# Patient Record
Sex: Male | Born: 1950 | Race: White | Hispanic: No | Marital: Married | Smoking: Never smoker
Health system: Southern US, Community
[De-identification: ages and names within clinical notes are randomized; demographics above are authoritative.]

## PROBLEM LIST (undated history)

## (undated) DIAGNOSIS — K219 Gastro-esophageal reflux disease without esophagitis: Secondary | ICD-10-CM

## (undated) DIAGNOSIS — M1990 Unspecified osteoarthritis, unspecified site: Secondary | ICD-10-CM

## (undated) DIAGNOSIS — E7800 Pure hypercholesterolemia, unspecified: Secondary | ICD-10-CM

## (undated) HISTORY — PX: FOOT SURGERY: SHX648

## (undated) HISTORY — DX: Pure hypercholesterolemia, unspecified: E78.00

## (undated) HISTORY — PX: COLONOSCOPY: SHX174

## (undated) HISTORY — PX: TONSILLECTOMY: SUR1361

---

## 2005-07-24 ENCOUNTER — Ambulatory Visit: Payer: Self-pay | Admitting: Unknown Physician Specialty

## 2009-09-12 ENCOUNTER — Ambulatory Visit: Payer: Self-pay | Admitting: Specialist

## 2010-10-01 ENCOUNTER — Ambulatory Visit: Payer: Self-pay | Admitting: Unknown Physician Specialty

## 2013-12-16 ENCOUNTER — Encounter: Payer: Self-pay | Admitting: Podiatry

## 2013-12-16 DIAGNOSIS — M21549 Acquired clubfoot, unspecified foot: Secondary | ICD-10-CM

## 2013-12-16 DIAGNOSIS — M2040 Other hammer toe(s) (acquired), unspecified foot: Secondary | ICD-10-CM

## 2013-12-16 DIAGNOSIS — M2010 Hallux valgus (acquired), unspecified foot: Secondary | ICD-10-CM

## 2013-12-21 ENCOUNTER — Ambulatory Visit (INDEPENDENT_AMBULATORY_CARE_PROVIDER_SITE_OTHER): Payer: BC Managed Care – PPO

## 2013-12-21 ENCOUNTER — Ambulatory Visit (INDEPENDENT_AMBULATORY_CARE_PROVIDER_SITE_OTHER): Payer: BC Managed Care – PPO | Admitting: Podiatry

## 2013-12-21 ENCOUNTER — Encounter: Payer: Self-pay | Admitting: Podiatry

## 2013-12-21 VITALS — BP 118/87 | HR 100 | Temp 99.2°F | Resp 16 | Ht 70.0 in | Wt 200.0 lb

## 2013-12-21 DIAGNOSIS — Z9889 Other specified postprocedural states: Secondary | ICD-10-CM

## 2013-12-21 NOTE — Progress Notes (Signed)
Willie Campos presents today 5 days status post Lapidus bunion repair, second metatarsal osteotomy with screw, hammertoe repair, PIPJ arthrodesis with screw second digit right foot. He denies fever chills nausea vomiting muscle aches or pains. He states that the cast is getting a little itchy. He has had very little pain. Minimal use of narcotics.  Objective: Vital signs are stable he is alert and oriented x3. He presents today with his cast and knee scooter. Manipulating the knee scooter quite well. Radiographic evaluation demonstrates very nice Lapidus procedure with screw fixation. Second metatarsal osteotomy was with screw fixation and second toe with screw fixation all in good alignment and intact. Evaluation of the cast and toes do not demonstrate any type of irritation to the toes whatsoever he has good sensation to all the toes right foot.  Assessment: Well-healing surgical foot status post 1 week.  Plan: I will followup with him in one week to remove the casts and the dressing of from the left foot. At that time we will more than likely reapply a cast and another dry sterile compressive dressing. I will followup with him at that time.

## 2013-12-22 ENCOUNTER — Encounter: Payer: BC Managed Care – PPO | Admitting: Podiatry

## 2013-12-27 NOTE — Progress Notes (Signed)
1. lapidus bunion repair right 2. 2nd metatarsal osteotomy rt foot 3. Hammer  Toe repair 2nd rt  4. Cast application rt   Percocet 10/325 25 mg #50 0 refills one to two po q 6 - 8 Phenergan 25 mg #30 0 refills one po q 6 - 8 Keflex  500 mg #20 0 refills one po bid

## 2013-12-28 ENCOUNTER — Encounter: Payer: Self-pay | Admitting: Podiatry

## 2013-12-28 ENCOUNTER — Ambulatory Visit (INDEPENDENT_AMBULATORY_CARE_PROVIDER_SITE_OTHER): Payer: BC Managed Care – PPO | Admitting: Podiatry

## 2013-12-28 VITALS — BP 115/74 | HR 75 | Temp 98.0°F | Resp 16

## 2013-12-28 DIAGNOSIS — Z9889 Other specified postprocedural states: Secondary | ICD-10-CM

## 2013-12-28 NOTE — Progress Notes (Signed)
Lapidus bunion repair , second met ost with screw , hammertoe repair , pipj arthrodesis with screw second right , everything is good. As he presents today in his cast with his wife other knee scooter.  Objective: Vital signs are stable he is alert and oriented x3. Cast was intact once removed reveals dry sterile dressing intact. Once removed reveals mild edema no erythema cellulitis drainage or odor. Hallux is rectus second digit is rectus with moderate edema to the first metatarsal space and overlying the second metatarsal. Margins are well coapted sutures are intact we went ahead and removed the sutures today. He has good range of motion of the first and second metatarsophalangeal joints and I encouraged range of motion.  Assessment: Well-healing surgical foot status post Lapidus procedure with bunion correction right second metatarsal osteotomy with screw and second hammertoe repair with screw.  Plan: Redressed today with a dry sterile compressive dressing encouraged range of motion exercises and will followup with him in 2 weeks. We did reapply a new fiberglass cast today.

## 2014-01-11 ENCOUNTER — Encounter: Payer: Self-pay | Admitting: Podiatry

## 2014-01-11 ENCOUNTER — Ambulatory Visit (INDEPENDENT_AMBULATORY_CARE_PROVIDER_SITE_OTHER): Payer: BC Managed Care – PPO | Admitting: Podiatry

## 2014-01-11 ENCOUNTER — Ambulatory Visit (INDEPENDENT_AMBULATORY_CARE_PROVIDER_SITE_OTHER): Payer: BC Managed Care – PPO

## 2014-01-11 VITALS — BP 117/60 | HR 90 | Resp 16

## 2014-01-11 DIAGNOSIS — Z9889 Other specified postprocedural states: Secondary | ICD-10-CM

## 2014-01-11 NOTE — Progress Notes (Signed)
01.09.15 post op , right foot  Lapidus bun repair , sec  Met ost with screw , hammertoe repair .the patient states it is good, it throbs every now and then. He denies fever chills nausea vomiting muscle aches and pains.  Objective: Vital signs are stable he is alert and oriented x3. Cast was intact. Once removed today in a dry sterile dressing was removed demonstrates a mildly edematous forefoot right. He has good range of motion first and second metatarsophalangeal joints however some dorsiflexion is noted. This is more than likely do to holding his toes up when utilizing crutches or knee scooter. His healing quite nicely.  Assessment: Well-healing surgical foot right status post Lapidus second metatarsal osteotomy and a hammertoe repair right.  Plan: Been in a compression garment and get him back into a Cam Walker I will followup with him in 2-3 weeks for another set of x-rays.

## 2014-01-25 ENCOUNTER — Ambulatory Visit (INDEPENDENT_AMBULATORY_CARE_PROVIDER_SITE_OTHER): Payer: BC Managed Care – PPO | Admitting: Podiatry

## 2014-01-25 ENCOUNTER — Ambulatory Visit (INDEPENDENT_AMBULATORY_CARE_PROVIDER_SITE_OTHER): Payer: BC Managed Care – PPO

## 2014-01-25 VITALS — BP 116/71 | HR 80 | Resp 16 | Ht 70.0 in | Wt 200.0 lb

## 2014-01-25 DIAGNOSIS — Z9889 Other specified postprocedural states: Secondary | ICD-10-CM

## 2014-01-25 NOTE — Progress Notes (Signed)
He presents today status post Lapidus repair second metatarsal osteotomy and hammertoe repair right. He denies fever chills nausea vomiting muscle aches and pains and states it seems to be doing pretty good. He continues to use of his Cam Walker in a nonweightbearing fashion.  Objective: Vital signs are stable he is alert and oriented x3. Mild edema about the right foot with rectus hallux right. Second metatarsal osteotomy and hammertoe repair second right does demonstrate edema with some contraction and medial deviation of the toe. Radiographs confirm this. Osteotomy and fusion site appears to be healing nicely.  Assessment: Well-healing surgical foot right.  Plan: Discussed etiology pathology conservative versus surgical therapies. I placed him in a Darco digital toe splint and encouraged some weightbearing in static stance. I will followup with him in 2 weeks for reevaluation.

## 2014-02-08 ENCOUNTER — Encounter: Payer: Self-pay | Admitting: Podiatry

## 2014-02-08 ENCOUNTER — Ambulatory Visit (INDEPENDENT_AMBULATORY_CARE_PROVIDER_SITE_OTHER): Payer: BC Managed Care – PPO

## 2014-02-08 ENCOUNTER — Ambulatory Visit (INDEPENDENT_AMBULATORY_CARE_PROVIDER_SITE_OTHER): Payer: BC Managed Care – PPO | Admitting: Podiatry

## 2014-02-08 VITALS — BP 106/62 | HR 93 | Resp 16

## 2014-02-08 DIAGNOSIS — Z9889 Other specified postprocedural states: Secondary | ICD-10-CM

## 2014-02-08 NOTE — Progress Notes (Signed)
Lapidus repair right foot second met ost, pt states doing great. He denies fever chills nausea vomiting muscle aches and pains. States that he has been walking without the boot some. He states that he was barefooted at the time.  Objective: Pulses are stable he is alert and oriented x3. Much decrease in edema to the right foot. Bunion repair is in good position as well as the second metatarsal osteotomy and hammertoe repair with screw. Radiographic evaluation demonstrates well-healing surgical foot.  Assessment: Well-healing surgical foot.  Plan: Discussed etiology pathology conservative versus surgical therapies. At this point we are going to encourage him to increase his range of motion to perform or exercises and I will allow him in a Darco shoe and occasionally in a regular shoe. I did expressed to him what to watch out for continues to be very careful with this foot. He understands that is amenable to it. Out followup with him in 2 weeks for another set of x-rays.

## 2014-02-20 ENCOUNTER — Ambulatory Visit (INDEPENDENT_AMBULATORY_CARE_PROVIDER_SITE_OTHER): Payer: BC Managed Care – PPO

## 2014-02-20 ENCOUNTER — Ambulatory Visit (INDEPENDENT_AMBULATORY_CARE_PROVIDER_SITE_OTHER): Payer: BC Managed Care – PPO | Admitting: Podiatry

## 2014-02-20 ENCOUNTER — Encounter: Payer: Self-pay | Admitting: Podiatry

## 2014-02-20 VITALS — BP 115/74 | HR 75 | Resp 16

## 2014-02-20 DIAGNOSIS — Z9889 Other specified postprocedural states: Secondary | ICD-10-CM

## 2014-02-20 NOTE — Progress Notes (Signed)
Post op , right foot , seems to be doing good, started to wear a shoe yesterday.  Objective: Vital signs are stable he is alert and oriented x3. Right foot mildly edematous. Status post Lapidus procedure second metatarsal osteotomy and hammertoe procedure. He has good range of motion. Radiographs confirm well-healing osteotomy sites and fixation sites.  Assessment: Well-healing surgical foot.  Plan: I will followup with him in one month I encouraged range of motion exercises

## 2014-03-20 ENCOUNTER — Ambulatory Visit (INDEPENDENT_AMBULATORY_CARE_PROVIDER_SITE_OTHER): Payer: BC Managed Care – PPO | Admitting: Podiatry

## 2014-03-20 ENCOUNTER — Encounter: Payer: Self-pay | Admitting: Podiatry

## 2014-03-20 ENCOUNTER — Ambulatory Visit (INDEPENDENT_AMBULATORY_CARE_PROVIDER_SITE_OTHER): Payer: BC Managed Care – PPO

## 2014-03-20 VITALS — BP 117/62 | HR 75 | Resp 16

## 2014-03-20 DIAGNOSIS — Z9889 Other specified postprocedural states: Secondary | ICD-10-CM

## 2014-03-20 NOTE — Progress Notes (Signed)
He presents today status post Lapidus procedure right foot second metatarsal osteotomy right foot hammertoe repair second right foot is gone to heal quite nicely. Had very little problem while he was on holiday walking and these and an edge.  Objective: His great range of motion without pain on palpation or range of motion of the first metatarsophalangeal joint and midfoot. Radiographic evaluation demonstrates very nice consolidated osteotomies and arthrodesis.  Assessment: Well-healing surgical foot status post Lapidus and second metatarsal osteotomy and hammertoe repair second right.  Plan: I will followup with him on as-needed basis. He is released from surgical care.

## 2014-07-09 ENCOUNTER — Emergency Department: Payer: Self-pay | Admitting: Emergency Medicine

## 2014-07-09 LAB — CBC WITH DIFFERENTIAL/PLATELET
BASOS ABS: 0.1 10*3/uL (ref 0.0–0.1)
BASOS PCT: 0.6 %
EOS ABS: 0.2 10*3/uL (ref 0.0–0.7)
EOS PCT: 1.6 %
HCT: 43.9 % (ref 40.0–52.0)
HGB: 14.6 g/dL (ref 13.0–18.0)
LYMPHS PCT: 6.9 %
Lymphocyte #: 0.8 10*3/uL — ABNORMAL LOW (ref 1.0–3.6)
MCH: 26.8 pg (ref 26.0–34.0)
MCHC: 33.2 g/dL (ref 32.0–36.0)
MCV: 81 fL (ref 80–100)
Monocyte #: 0.9 x10 3/mm (ref 0.2–1.0)
Monocyte %: 7.4 %
Neutrophil #: 10 10*3/uL — ABNORMAL HIGH (ref 1.4–6.5)
Neutrophil %: 83.5 %
Platelet: 223 10*3/uL (ref 150–440)
RBC: 5.42 10*6/uL (ref 4.40–5.90)
RDW: 14.8 % — AB (ref 11.5–14.5)
WBC: 11.9 10*3/uL — AB (ref 3.8–10.6)

## 2014-07-09 LAB — COMPREHENSIVE METABOLIC PANEL
ALK PHOS: 74 U/L
AST: 21 U/L (ref 15–37)
Albumin: 3.6 g/dL (ref 3.4–5.0)
Anion Gap: 6 — ABNORMAL LOW (ref 7–16)
BILIRUBIN TOTAL: 0.6 mg/dL (ref 0.2–1.0)
BUN: 21 mg/dL — ABNORMAL HIGH (ref 7–18)
CALCIUM: 8.3 mg/dL — AB (ref 8.5–10.1)
CHLORIDE: 107 mmol/L (ref 98–107)
CREATININE: 1.68 mg/dL — AB (ref 0.60–1.30)
Co2: 25 mmol/L (ref 21–32)
EGFR (Non-African Amer.): 43 — ABNORMAL LOW
GFR CALC AF AMER: 50 — AB
GLUCOSE: 123 mg/dL — AB (ref 65–99)
Osmolality: 280 (ref 275–301)
Potassium: 3.7 mmol/L (ref 3.5–5.1)
SGPT (ALT): 27 U/L
Sodium: 138 mmol/L (ref 136–145)
Total Protein: 7.2 g/dL (ref 6.4–8.2)

## 2014-07-09 LAB — URINALYSIS, COMPLETE
BACTERIA: NONE SEEN
Bilirubin,UR: NEGATIVE
Blood: NEGATIVE
Glucose,UR: NEGATIVE mg/dL (ref 0–75)
Ketone: NEGATIVE
Leukocyte Esterase: NEGATIVE
Nitrite: NEGATIVE
PROTEIN: NEGATIVE
Ph: 5 (ref 4.5–8.0)
RBC,UR: 2 /HPF (ref 0–5)
Specific Gravity: 1.015 (ref 1.003–1.030)
Squamous Epithelial: NONE SEEN
WBC UR: NONE SEEN /HPF (ref 0–5)

## 2014-07-11 ENCOUNTER — Ambulatory Visit: Payer: Self-pay | Admitting: Urology

## 2014-07-13 ENCOUNTER — Ambulatory Visit: Payer: Self-pay | Admitting: Urology

## 2014-08-04 ENCOUNTER — Emergency Department: Payer: Self-pay | Admitting: Student

## 2015-01-22 DIAGNOSIS — K625 Hemorrhage of anus and rectum: Secondary | ICD-10-CM | POA: Insufficient documentation

## 2015-01-22 DIAGNOSIS — D126 Benign neoplasm of colon, unspecified: Secondary | ICD-10-CM | POA: Insufficient documentation

## 2015-04-09 ENCOUNTER — Other Ambulatory Visit: Payer: Self-pay | Admitting: Internal Medicine

## 2015-04-09 DIAGNOSIS — R05 Cough: Secondary | ICD-10-CM

## 2015-04-09 DIAGNOSIS — R059 Cough, unspecified: Secondary | ICD-10-CM

## 2015-04-10 ENCOUNTER — Ambulatory Visit
Admission: RE | Admit: 2015-04-10 | Discharge: 2015-04-10 | Disposition: A | Discharge status: Home / Self Care | Payer: BLUE CROSS/BLUE SHIELD | Source: Ambulatory Visit | Attending: Internal Medicine | Admitting: Internal Medicine

## 2015-04-10 ENCOUNTER — Other Ambulatory Visit: Payer: Self-pay | Admitting: Internal Medicine

## 2015-04-10 DIAGNOSIS — R079 Chest pain, unspecified: Secondary | ICD-10-CM | POA: Insufficient documentation

## 2015-04-10 DIAGNOSIS — R0602 Shortness of breath: Secondary | ICD-10-CM

## 2015-04-24 ENCOUNTER — Other Ambulatory Visit: Payer: Self-pay | Admitting: Internal Medicine

## 2015-04-24 DIAGNOSIS — R05 Cough: Secondary | ICD-10-CM

## 2015-04-24 DIAGNOSIS — J986 Disorders of diaphragm: Secondary | ICD-10-CM

## 2015-04-24 DIAGNOSIS — R059 Cough, unspecified: Secondary | ICD-10-CM

## 2015-05-01 ENCOUNTER — Ambulatory Visit
Admission: RE | Admit: 2015-05-01 | Discharge: 2015-05-01 | Disposition: A | Discharge status: Home / Self Care | Payer: BLUE CROSS/BLUE SHIELD | Source: Ambulatory Visit | Attending: Internal Medicine | Admitting: Internal Medicine

## 2015-05-01 DIAGNOSIS — K228 Other specified diseases of esophagus: Secondary | ICD-10-CM | POA: Diagnosis not present

## 2015-05-01 DIAGNOSIS — R05 Cough: Secondary | ICD-10-CM

## 2015-05-01 DIAGNOSIS — J986 Disorders of diaphragm: Secondary | ICD-10-CM

## 2015-05-01 DIAGNOSIS — R059 Cough, unspecified: Secondary | ICD-10-CM

## 2015-06-20 ENCOUNTER — Encounter: Payer: Self-pay | Admitting: Family Medicine

## 2015-07-10 ENCOUNTER — Other Ambulatory Visit: Payer: Self-pay | Admitting: Family Medicine

## 2015-07-12 ENCOUNTER — Ambulatory Visit: Payer: Self-pay | Admitting: Family Medicine

## 2015-07-25 ENCOUNTER — Encounter: Payer: Self-pay | Admitting: Family Medicine

## 2015-07-25 ENCOUNTER — Ambulatory Visit (INDEPENDENT_AMBULATORY_CARE_PROVIDER_SITE_OTHER): Payer: BLUE CROSS/BLUE SHIELD | Admitting: Family Medicine

## 2015-07-25 VITALS — BP 116/84 | HR 84 | Temp 98.0°F | Resp 16 | Ht 69.0 in | Wt 202.3 lb

## 2015-07-25 DIAGNOSIS — E785 Hyperlipidemia, unspecified: Secondary | ICD-10-CM

## 2015-07-25 MED ORDER — ROSUVASTATIN CALCIUM 20 MG PO TABS: 20.0000 mg | ORAL_TABLET | Freq: Every day | ORAL | Status: DC

## 2015-07-25 NOTE — Progress Notes (Signed)
Name: Willie Campos   MRN: [id number]    DOB: 12/26/1950   Date:07/25/2015       Progress Note  Subjective  Chief Complaint  Chief Complaint  Patient presents with  . Hyperlipidemia    pt here for 6 month f/u    Hyperlipidemia This is a chronic problem. The current episode started more than 1 year ago. The problem is controlled. Recent lipid tests were reviewed and are normal. Factors aggravating his hyperlipidemia include fatty foods. Pertinent negatives include no chest pain, focal weakness, myalgias or shortness of breath. Current antihyperlipidemic treatment includes statins. The current treatment provides moderate improvement of lipids. There are no compliance problems.  Risk factors for coronary artery disease include dyslipidemia, a sedentary lifestyle, stress and male sex.  Erectile Dysfunction This is a chronic problem. The current episode started more than 1 year ago. The problem has been gradually improving since onset. The nature of his difficulty is achieving erection, maintaining erection and penetration. He reports no anxiety. Irritative symptoms do not include frequency or urgency. Obstructive symptoms do not include incomplete emptying. Pertinent negatives include no chills, dysuria, hematuria, hesitancy or inability to urinate. The symptoms are aggravated by alcohol use, poor sleep and stress. Past treatments include sildenafil. The treatment provided moderate relief. He has been using treatment for 2 or more years. He has had nasal congestion caused by medications.      Past Medical History  Diagnosis Date  . High cholesterol     Social History  Substance Use Topics  . Smoking status: Never Smoker   . Smokeless tobacco: Never Used  . Alcohol Use: Yes     Comment: social     Current outpatient prescriptions:  .  Ascorbic Acid (VITAMIN C PO), Take by mouth daily., Disp: , Rfl:  .  aspirin 81 MG tablet, Take 81 mg by mouth daily., Disp: , Rfl:  .   Cholecalciferol (VITAMIN D PO), Take by mouth daily., Disp: , Rfl:  .  CIALIS 5 MG tablet, , Disp: , Rfl: 2 .  GARLIC PO, Take by mouth daily., Disp: , Rfl:  .  rosuvastatin (CRESTOR) 20 MG tablet, Take 1 tablet (20 mg total) by mouth daily., Disp: 90 tablet, Rfl: 3 .  VIAGRA 100 MG tablet, , Disp: , Rfl:  .  VITAMIN A PO, Take by mouth daily., Disp: , Rfl:   No Known Allergies  Review of Systems  Constitutional: Negative for fever, chills and weight loss.  HENT: Negative for congestion, hearing loss, sore throat and tinnitus.   Eyes: Negative for blurred vision, double vision and redness.  Respiratory: Negative for cough, hemoptysis and shortness of breath.   Cardiovascular: Negative for chest pain, palpitations, orthopnea, claudication and leg swelling.  Gastrointestinal: Negative for heartburn, nausea, vomiting, diarrhea, constipation and blood in stool.  Genitourinary: Negative for dysuria, hesitancy, urgency, frequency, hematuria and incomplete emptying.       ED  Musculoskeletal: Negative for myalgias, back pain, joint pain, falls and neck pain.  Skin: Negative for itching.  Neurological: Negative for dizziness, tingling, tremors, focal weakness, seizures, loss of consciousness, weakness and headaches.  Endo/Heme/Allergies: Does not bruise/bleed easily.  Psychiatric/Behavioral: Negative for depression and substance abuse. The patient is not nervous/anxious and does not have insomnia.      Objective  Filed Vitals:   07/25/15 0741  BP: 116/84  Pulse: 84  Temp: 98 F (36.7 C)  Resp: 16  Height: 5\' 9"  (1.753 m)  Weight: 202 lb 5  oz (91.768 kg)  SpO2: 97%     Physical Exam  Constitutional: He is oriented to person, place, and time and well-developed, well-nourished, and in no distress.  HENT:  Head: Normocephalic.  Eyes: EOM are normal. Pupils are equal, round, and reactive to light.  Neck: Normal range of motion. Neck supple. No thyromegaly present.  Cardiovascular:  Normal rate, regular rhythm and normal heart sounds.   No murmur heard. Pulmonary/Chest: Effort normal and breath sounds normal. No respiratory distress. He has no wheezes.  Abdominal: Soft. Bowel sounds are normal.  Musculoskeletal: Normal range of motion. He exhibits no edema.  Lymphadenopathy:    He has no cervical adenopathy.  Neurological: He is alert and oriented to person, place, and time. No cranial nerve deficit. Gait normal. Coordination normal.  Skin: Skin is warm and dry. No rash noted.  Psychiatric: Affect and judgment normal.      Assessment & Plan  1. Hyperlipidemia  - Comprehensive Metabolic Panel (CMET) - Lipid Profile - TSH

## 2015-07-26 ENCOUNTER — Telehealth: Payer: Self-pay | Admitting: Emergency Medicine

## 2015-07-26 LAB — COMPREHENSIVE METABOLIC PANEL
ALBUMIN: 4.1 g/dL (ref 3.6–4.8)
ALT: 19 IU/L (ref 0–44)
AST: 23 IU/L (ref 0–40)
Albumin/Globulin Ratio: 1.9 (ref 1.1–2.5)
Alkaline Phosphatase: 66 IU/L (ref 39–117)
BUN / CREAT RATIO: 13 (ref 10–22)
BUN: 12 mg/dL (ref 8–27)
Bilirubin Total: 0.3 mg/dL (ref 0.0–1.2)
CALCIUM: 8.8 mg/dL (ref 8.6–10.2)
CO2: 25 mmol/L (ref 18–29)
CREATININE: 0.91 mg/dL (ref 0.76–1.27)
Chloride: 104 mmol/L (ref 97–108)
GFR, EST AFRICAN AMERICAN: 105 mL/min/{1.73_m2} (ref >59)
GFR, EST NON AFRICAN AMERICAN: 91 mL/min/{1.73_m2} (ref >59)
GLOBULIN, TOTAL: 2.2 g/dL (ref 1.5–4.5)
Glucose: 108 mg/dL — ABNORMAL HIGH (ref 65–99)
POTASSIUM: 4.7 mmol/L (ref 3.5–5.2)
SODIUM: 143 mmol/L (ref 134–144)
Total Protein: 6.3 g/dL (ref 6.0–8.5)

## 2015-07-26 LAB — LIPID PANEL
CHOL/HDL RATIO: 3.1 ratio (ref 0.0–5.0)
Cholesterol, Total: 157 mg/dL (ref 100–199)
HDL: 51 mg/dL (ref >39)
LDL CALC: 85 mg/dL (ref 0–99)
Triglycerides: 103 mg/dL (ref 0–149)
VLDL Cholesterol Cal: 21 mg/dL (ref 5–40)

## 2015-07-26 LAB — TSH: TSH: 3.65 u[IU]/mL (ref 0.450–4.500)

## 2015-07-26 NOTE — Telephone Encounter (Signed)
Patient notified

## 2015-11-12 ENCOUNTER — Other Ambulatory Visit: Payer: Self-pay | Admitting: Emergency Medicine

## 2015-11-12 DIAGNOSIS — R413 Other amnesia: Secondary | ICD-10-CM

## 2015-11-13 LAB — CBC WITH DIFFERENTIAL/PLATELET
BASOS: 1 %
Basophils Absolute: 0 10*3/uL (ref 0.0–0.2)
EOS (ABSOLUTE): 0.2 10*3/uL (ref 0.0–0.4)
Eos: 3 %
Hematocrit: 44.7 % (ref 37.5–51.0)
Hemoglobin: 15.3 g/dL (ref 12.6–17.7)
Immature Grans (Abs): 0 10*3/uL (ref 0.0–0.1)
Immature Granulocytes: 0 %
LYMPHS ABS: 1.5 10*3/uL (ref 0.7–3.1)
Lymphs: 20 %
MCH: 27.5 pg (ref 26.6–33.0)
MCHC: 34.2 g/dL (ref 31.5–35.7)
MCV: 80 fL (ref 79–97)
MONOCYTES: 8 %
Monocytes Absolute: 0.6 10*3/uL (ref 0.1–0.9)
NEUTROS ABS: 5.1 10*3/uL (ref 1.4–7.0)
Neutrophils: 68 %
Platelets: 215 10*3/uL (ref 150–379)
RBC: 5.56 x10E6/uL (ref 4.14–5.80)
RDW: 13.9 % (ref 12.3–15.4)
WBC: 7.4 10*3/uL (ref 3.4–10.8)

## 2015-11-13 LAB — COMPREHENSIVE METABOLIC PANEL
A/G RATIO: 1.9 (ref 1.1–2.5)
ALT: 20 IU/L (ref 0–44)
AST: 20 IU/L (ref 0–40)
Albumin: 4.2 g/dL (ref 3.6–4.8)
Alkaline Phosphatase: 65 IU/L (ref 39–117)
BUN / CREAT RATIO: 18 (ref 10–22)
BUN: 15 mg/dL (ref 8–27)
Bilirubin Total: 0.4 mg/dL (ref 0.0–1.2)
CO2: 24 mmol/L (ref 18–29)
CREATININE: 0.85 mg/dL (ref 0.76–1.27)
Calcium: 9.4 mg/dL (ref 8.6–10.2)
Chloride: 102 mmol/L (ref 97–106)
GFR, EST AFRICAN AMERICAN: 109 mL/min/{1.73_m2} (ref >59)
GFR, EST NON AFRICAN AMERICAN: 94 mL/min/{1.73_m2} (ref >59)
Globulin, Total: 2.2 g/dL (ref 1.5–4.5)
Glucose: 91 mg/dL (ref 65–99)
Potassium: 4.3 mmol/L (ref 3.5–5.2)
Sodium: 142 mmol/L (ref 136–144)
Total Protein: 6.4 g/dL (ref 6.0–8.5)

## 2015-11-13 LAB — TSH: TSH: 2.77 u[IU]/mL (ref 0.450–4.500)

## 2015-11-15 ENCOUNTER — Ambulatory Visit: Payer: BLUE CROSS/BLUE SHIELD | Admitting: Family Medicine

## 2015-11-15 LAB — METHYLMALONIC ACID, SERUM: Methylmalonic Acid: 126 nmol/L (ref 0–378)

## 2015-11-19 ENCOUNTER — Encounter: Payer: Self-pay | Admitting: Family Medicine

## 2015-11-19 ENCOUNTER — Ambulatory Visit (INDEPENDENT_AMBULATORY_CARE_PROVIDER_SITE_OTHER): Payer: BLUE CROSS/BLUE SHIELD | Admitting: Family Medicine

## 2015-11-19 ENCOUNTER — Other Ambulatory Visit: Payer: Self-pay | Admitting: Family Medicine

## 2015-11-19 VITALS — BP 118/76 | HR 93 | Temp 97.1°F | Resp 18 | Ht 69.0 in | Wt 204.5 lb

## 2015-11-19 DIAGNOSIS — R413 Other amnesia: Secondary | ICD-10-CM

## 2015-11-19 NOTE — Progress Notes (Signed)
Name: Willie Campos   MRN: [id number]    DOB: [birth year]   Date:11/19/2015       Progress Note  Subjective  Chief Complaint  Chief Complaint  Patient presents with  . Cognitive Impairment     follow up lab work  . Hyperlipidemia    HPI  Patient's wife complains that he is getting more forgetful. He lost credit card recently and states there've been several forgetful events. He however denies any significant problem with memory. There's been no change in his mentation and his ability to function at work and sleep habits no depression or anxiety.    Past Medical History  Diagnosis Date  . High cholesterol     Social History  Substance Use Topics  . Smoking status: Never Smoker   . Smokeless tobacco: Never Used  . Alcohol Use: Yes     Comment: social     Current outpatient prescriptions:  .  Ascorbic Acid (VITAMIN C PO), Take by mouth daily., Disp: , Rfl:  .  aspirin 81 MG tablet, Take 81 mg by mouth daily., Disp: , Rfl:  .  Cholecalciferol (VITAMIN D PO), Take by mouth daily., Disp: , Rfl:  .  CIALIS 5 MG tablet, , Disp: , Rfl: 2 .  GARLIC PO, Take by mouth daily., Disp: , Rfl:  .  rosuvastatin (CRESTOR) 20 MG tablet, Take 1 tablet (20 mg total) by mouth daily., Disp: 90 tablet, Rfl: 3 .  VIAGRA 100 MG tablet, , Disp: , Rfl:  .  VITAMIN A PO, Take by mouth daily., Disp: , Rfl:   No Known Allergies  Review of Systems  Constitutional: Negative for fever, chills and weight loss.  HENT: Negative for congestion, hearing loss, sore throat and tinnitus.   Eyes: Negative for blurred vision, double vision and redness.  Respiratory: Negative for cough, hemoptysis and shortness of breath.   Cardiovascular: Negative for chest pain, palpitations, orthopnea, claudication and leg swelling.  Gastrointestinal: Negative for heartburn, nausea, vomiting, diarrhea, constipation and blood in stool.  Genitourinary: Negative for dysuria, urgency, frequency and hematuria.   Musculoskeletal: Negative for myalgias, back pain, joint pain, falls and neck pain.  Skin: Negative for itching.  Neurological: Negative for dizziness, tingling, tremors, focal weakness, seizures, loss of consciousness, weakness and headaches.  Endo/Heme/Allergies: Does not bruise/bleed easily.  Psychiatric/Behavioral: Positive for memory loss (reported by wife). Negative for depression and substance abuse. The patient is not nervous/anxious and does not have insomnia.      Objective  Filed Vitals:   11/19/15 0859  BP: 118/76  Pulse: 93  Temp: 97.1 F (36.2 C)  TempSrc: Oral  Resp: 18  Height: 5\' 9"  (1.753 m)  Weight: 204 lb 8 oz (92.761 kg)  SpO2: 96%     Physical Exam  Constitutional: He is oriented to person, place, and time and well-developed, well-nourished, and in no distress.  HENT:  Head: Normocephalic.  Eyes: EOM are normal. Pupils are equal, round, and reactive to light.  Neck: Normal range of motion. Neck supple. No thyromegaly present.  Cardiovascular: Normal rate, regular rhythm and normal heart sounds.   No murmur heard. Pulmonary/Chest: Effort normal and breath sounds normal. No respiratory distress. He has no wheezes.  Abdominal: Soft. Bowel sounds are normal.  Musculoskeletal: Normal range of motion. He exhibits no edema.  Lymphadenopathy:    He has no cervical adenopathy.  Neurological: He is alert and oriented to person, place, and time. No cranial nerve deficit. Gait normal. Coordination normal.  Patient scores 30 out of 30 on the MMSE  Skin: Skin is warm and dry. No rash noted.  Psychiatric: Affect and judgment normal.      Assessment & Plan  1. Memory changes The patient have scored a perfect 30 out of 30 on MMSE I see no significant memory problems. He may be experiencing a little mild 9 senescence. He is encouraged to start new hobbies to do some things in a different way to read more to crossword pulses etc. to stimulate his brain and memory  and to avoid prolonged TV watching. His lab work was completely normal and no MRI and neurology evaluation further workup is indicated

## 2015-11-19 NOTE — Patient Instructions (Signed)
memory

## 2015-11-26 ENCOUNTER — Encounter: Payer: Self-pay | Admitting: Family Medicine

## 2016-01-17 ENCOUNTER — Encounter: Payer: BLUE CROSS/BLUE SHIELD | Admitting: Family Medicine

## 2016-01-24 ENCOUNTER — Other Ambulatory Visit: Payer: Self-pay | Admitting: Family Medicine

## 2016-01-28 ENCOUNTER — Other Ambulatory Visit: Payer: Self-pay | Admitting: Family Medicine

## 2016-06-17 ENCOUNTER — Ambulatory Visit (INDEPENDENT_AMBULATORY_CARE_PROVIDER_SITE_OTHER): Payer: BLUE CROSS/BLUE SHIELD | Admitting: Family Medicine

## 2016-06-17 ENCOUNTER — Encounter: Payer: Self-pay | Admitting: Family Medicine

## 2016-06-17 VITALS — BP 112/70 | HR 98 | Temp 98.7°F | Resp 18 | Ht 69.0 in | Wt 202.0 lb

## 2016-06-17 DIAGNOSIS — Z0000 Encounter for general adult medical examination without abnormal findings: Secondary | ICD-10-CM

## 2016-06-17 NOTE — Progress Notes (Signed)
Name: Willie Campos   MRN: [id number]    DOB: [birth year]   Date:06/17/2016       Progress Note  Subjective  Chief Complaint  Chief Complaint  Patient presents with  . Annual Exam  This patient is normally followed by Dr. Rutherford Nail, and is new to me.  HPI  Pt. Is here for Complete Physical Exam. He is doing well. Last colonoscopy was in 2016 Last prostate exam was in 2015.   Past Medical History  Diagnosis Date  . High cholesterol     Past Surgical History  Procedure Laterality Date  . Foot surgery Right   . Tonsillectomy    . Colonoscopy      Family History  Problem Relation Age of Onset  . Diabetes Father     Social History   Social History  . Marital Status: Married    Spouse Name: N/A  . Number of Children: N/A  . Years of Education: N/A   Occupational History  . Not on file.   Social History Main Topics  . Smoking status: Never Smoker   . Smokeless tobacco: Never Used  . Alcohol Use: Yes     Comment: social  . Drug Use: Not on file  . Sexual Activity: Not on file   Other Topics Concern  . Not on file   Social History Narrative     Current outpatient prescriptions:  .  Ascorbic Acid (VITAMIN C PO), Take by mouth daily., Disp: , Rfl:  .  aspirin 81 MG tablet, Take 81 mg by mouth daily., Disp: , Rfl:  .  Cholecalciferol (VITAMIN D PO), Take by mouth daily., Disp: , Rfl:  .  CIALIS 5 MG tablet, TAKE 1 BY MOUTH DAILY, Disp: 90 tablet, Rfl: 0 .  GARLIC PO, Take by mouth daily., Disp: , Rfl:  .  rosuvastatin (CRESTOR) 20 MG tablet, Take 1 tablet (20 mg total) by mouth daily., Disp: 90 tablet, Rfl: 3 .  VIAGRA 100 MG tablet, TAKE 1 BY MOUTH AS DIRECTED, Disp: 30 tablet, Rfl: 0 .  VITAMIN A PO, Take by mouth daily., Disp: , Rfl:   No Known Allergies   Review of Systems  Constitutional: Negative for fever and chills.  HENT: Positive for congestion. Negative for ear pain.   Eyes: Positive for blurred vision (noticing some floaters in both eyes  recently.). Negative for pain and redness.  Respiratory: Negative for shortness of breath.   Cardiovascular: Negative for chest pain and leg swelling.  Gastrointestinal: Positive for heartburn (occasional heartburn). Negative for nausea, vomiting, blood in stool and melena.  Genitourinary: Negative for dysuria and hematuria.  Musculoskeletal: Negative for myalgias and back pain.  Skin: Negative for rash.  Neurological: Negative for dizziness and headaches.  Psychiatric/Behavioral: Negative for depression. The patient is not nervous/anxious.      Objective  Filed Vitals:   06/17/16 1407  BP: 112/70  Pulse: 98  Temp: 98.7 F (37.1 C)  TempSrc: Oral  Resp: 18  Height: 5\' 9"  (1.753 m)  Weight: 202 lb (91.627 kg)  SpO2: 97%    Physical Exam  Constitutional: He is oriented to person, place, and time and well-developed, well-nourished, and in no distress.  HENT:  Head: Normocephalic and atraumatic.  Eyes: Conjunctivae are normal. Pupils are equal, round, and reactive to light.  Neck: Normal range of motion. Neck supple.  Cardiovascular: Normal rate, regular rhythm and normal heart sounds.   No murmur heard. Pulmonary/Chest: Effort normal and breath sounds normal. He  has no wheezes.  Abdominal: Soft. Bowel sounds are normal. There is no tenderness.  Genitourinary: Rectum normal and prostate normal. Rectal exam shows no external hemorrhoid and no tenderness. Prostate is not enlarged and not tender.  Neurological: He is alert and oriented to person, place, and time.  Psychiatric: Mood, memory, affect and judgment normal.  Nursing note and vitals reviewed.    Assessment & Plan  1. Annual physical exam Obtain age-appropriate laboratory screenings - Lipid Profile - COMPLETE METABOLIC PANEL WITH GFR - TSH - PSA - Vitamin D (25 hydroxy)   Lavonne Kinderman [facility] 06/17/2016 2:21 PM

## 2016-06-19 LAB — PSA: PSA: 1.67 ng/mL (ref <=4.00)

## 2016-06-19 LAB — COMPLETE METABOLIC PANEL WITH GFR
ALBUMIN: 4.3 g/dL (ref 3.6–5.1)
ALK PHOS: 54 U/L (ref 40–115)
ALT: 21 U/L (ref 9–46)
AST: 22 U/L (ref 10–35)
BUN: 14 mg/dL (ref 7–25)
CALCIUM: 9 mg/dL (ref 8.6–10.3)
CO2: 22 mmol/L (ref 20–31)
Chloride: 104 mmol/L (ref 98–110)
Creat: 0.92 mg/dL (ref 0.70–1.25)
GFR, EST NON AFRICAN AMERICAN: 89 mL/min (ref >=60)
GFR, Est African American: >89 mL/min (ref >=60)
Glucose, Bld: 99 mg/dL (ref 65–99)
POTASSIUM: 4.4 mmol/L (ref 3.5–5.3)
Sodium: 139 mmol/L (ref 135–146)
Total Bilirubin: 0.6 mg/dL (ref 0.2–1.2)
Total Protein: 6.9 g/dL (ref 6.1–8.1)

## 2016-06-19 LAB — TSH: TSH: 2.91 m[IU]/L (ref 0.40–4.50)

## 2016-06-19 LAB — LIPID PANEL
CHOL/HDL RATIO: 2.9 ratio (ref <=5.0)
CHOLESTEROL: 167 mg/dL (ref 125–200)
HDL: 57 mg/dL (ref >=40)
LDL Cholesterol: 89 mg/dL (ref <130)
Triglycerides: 104 mg/dL (ref <150)
VLDL: 21 mg/dL (ref <30)

## 2016-06-19 LAB — VITAMIN D 25 HYDROXY (VIT D DEFICIENCY, FRACTURES): Vit D, 25-Hydroxy: 89 ng/mL (ref 30–100)

## 2016-06-20 ENCOUNTER — Telehealth: Payer: Self-pay | Admitting: Emergency Medicine

## 2016-06-20 NOTE — Telephone Encounter (Signed)
Patient notified of lab results

## 2016-07-23 IMAGING — CT CT STONE STUDY
3 of 4 series · 5 of 16 positions shown, 6 images · non-contrast
Comparison: 09/12/2009

CLINICAL DATA: Bilateral flank pain without hematuria.

EXAM:
CT ABDOMEN AND PELVIS WITHOUT CONTRAST
TECHNIQUE: Multidetector CT imaging of the abdomen and pelvis was performed
following the standard protocol without IV contrast.

[Series 4: lung · axial · 0.78mm/px · z∈[-638,-638]mm · 1 of 30 slices shown, 2 images]
[im 1/30  soft-tissue]
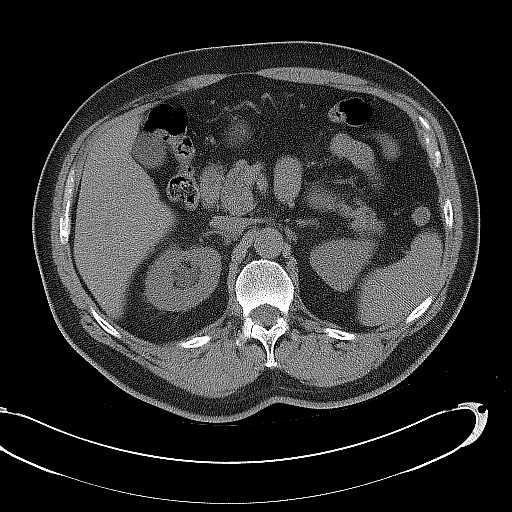
[im 1/30  bone]
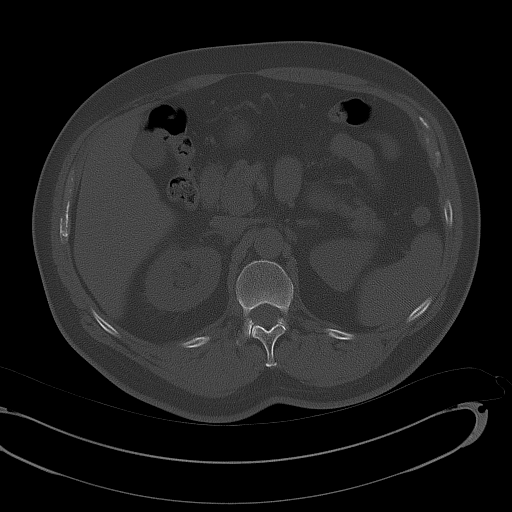

[Series 5: coronal · coronal · 0.77mm/px · 3 of 135 slices shown]
[im 34/135  soft-tissue]
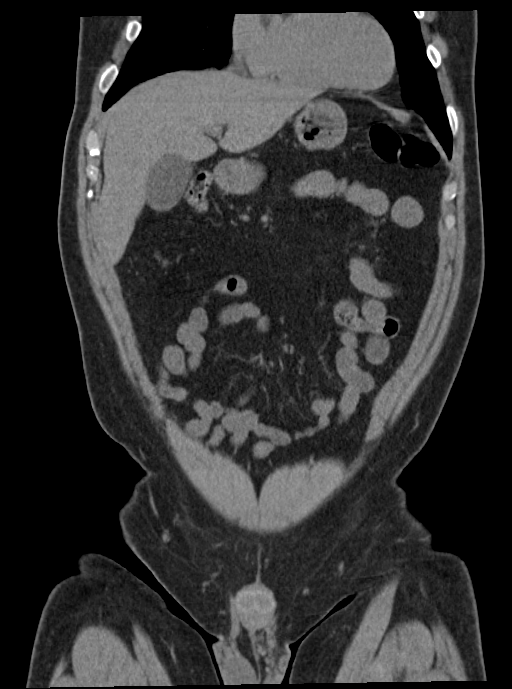
[im 68/135  soft-tissue]
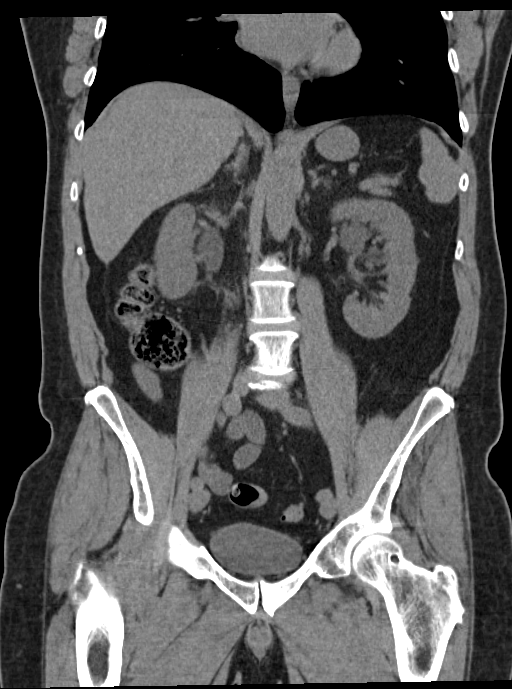
[im 101/135  soft-tissue]
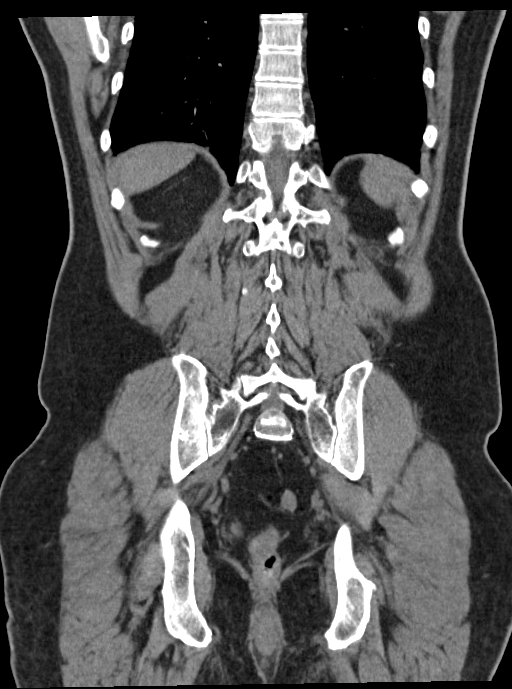

[Series 6: sagittal · sagittal · 0.62mm/px · 1 of 167 slices shown]
[im 67/167  soft-tissue]
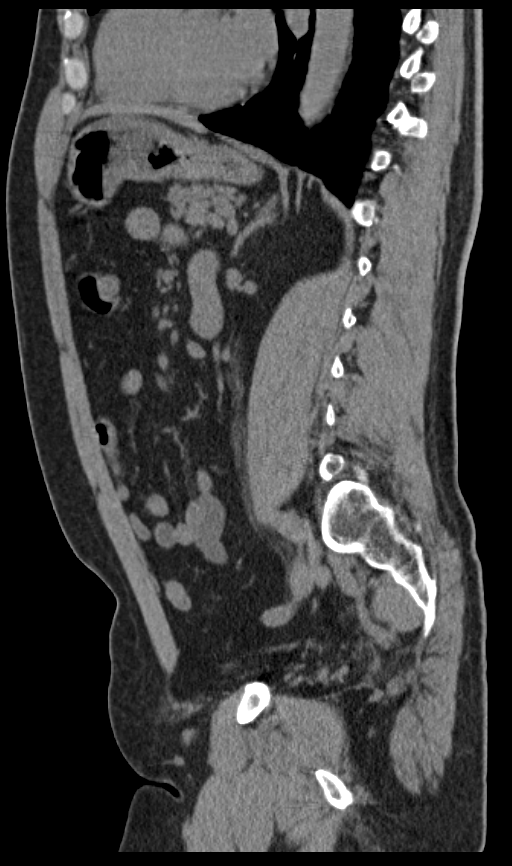

[5 of 16 positions shown; findings below may reference images not displayed]

FINDINGS: BODY WALL: Unremarkable.

LOWER CHEST: Unremarkable.

ABDOMEN/PELVIS:

Liver: No focal abnormality.

Biliary: No evidence of biliary obstruction or stone.

Pancreas: Unremarkable.

Spleen: Unremarkable.

Adrenals: Unremarkable.

Kidneys and ureters: Hydronephrosis and perinephric edema on the
right secondary to a 5 mm stone in the distal right ureter, just
before the ureteral vesicular junction. Left renal sinus cysts.
Punctate stone in the interpolar right kidney.

Bladder: Unremarkable.

Reproductive: Unremarkable.

Bowel: No obstruction. Mild distal colonic diverticulosis.

Retroperitoneum: No mass or adenopathy.

Peritoneum: No ascites or pneumoperitoneum.

Vascular: No acute abnormality.

OSSEOUS: No acute abnormalities. Focally advanced degenerative disc
narrowing at L4-5.
IMPRESSION: 1. Obstructing 5 mm stone in the distal right ureter.
2. Tiny right renal calculus.

## 2016-07-31 ENCOUNTER — Telehealth: Payer: Self-pay | Admitting: Family Medicine

## 2016-07-31 NOTE — Telephone Encounter (Signed)
Returned call and left a voice message to discuss his medications

## 2016-08-01 NOTE — Telephone Encounter (Signed)
Returned call again and left a voice message. Please schedule this patient for an appointment for medication refill

## 2016-08-04 MED ORDER — TADALAFIL 5 MG PO TABS: ORAL_TABLET | ORAL | 0 refills | Status: DC

## 2016-08-04 MED ORDER — SILDENAFIL CITRATE 100 MG PO TABS: ORAL_TABLET | ORAL | 0 refills | Status: DC

## 2016-08-04 NOTE — Telephone Encounter (Signed)
Pt can be reached @ [phone]

## 2016-08-04 NOTE — Telephone Encounter (Signed)
Prescription for Cialis and Viagra have been sent to patient's mail-order pharmacy

## 2016-08-05 NOTE — Telephone Encounter (Signed)
Pt informed

## 2016-10-28 ENCOUNTER — Other Ambulatory Visit: Payer: Self-pay | Admitting: Emergency Medicine

## 2016-10-28 MED ORDER — TADALAFIL 5 MG PO TABS: ORAL_TABLET | ORAL | 0 refills | Status: DC

## 2016-10-28 MED ORDER — ROSUVASTATIN CALCIUM 20 MG PO TABS
20.0000 mg | ORAL_TABLET | Freq: Every day | ORAL | 0 refills | Status: DC
Start: 2016-10-28 — End: 2016-12-30

## 2016-10-28 MED ORDER — SILDENAFIL CITRATE 100 MG PO TABS: ORAL_TABLET | ORAL | 0 refills | Status: DC

## 2016-12-30 ENCOUNTER — Other Ambulatory Visit: Payer: Self-pay | Admitting: Emergency Medicine

## 2016-12-30 MED ORDER — SILDENAFIL CITRATE 100 MG PO TABS: ORAL_TABLET | ORAL | 0 refills | Status: DC

## 2016-12-30 MED ORDER — ROSUVASTATIN CALCIUM 20 MG PO TABS: 20.0000 mg | ORAL_TABLET | Freq: Every day | ORAL | 0 refills | Status: DC

## 2017-03-31 ENCOUNTER — Telehealth: Payer: Self-pay | Admitting: Family Medicine

## 2017-03-31 NOTE — Telephone Encounter (Signed)
Spoke with pharmacist from [place] ([place]) and he has been notified that patient needs to schedule medication refill appointment, pt has not been seen since 06/2016 and he was give a refill in 12/2016 and was then told he needed to schedule an appointment before another medication can be refilled, reached out to patient and left voicemail

## 2017-03-31 NOTE — Telephone Encounter (Signed)
Tiwana from [facility] ([facility]) requesting refill on rosuvastatin 20mg .  (P) [phone] (F) [phone]

## 2017-07-13 DIAGNOSIS — E785 Hyperlipidemia, unspecified: Secondary | ICD-10-CM | POA: Insufficient documentation

## 2018-01-08 DIAGNOSIS — M179 Osteoarthritis of knee, unspecified: Secondary | ICD-10-CM | POA: Insufficient documentation

## 2018-01-08 DIAGNOSIS — M1710 Unilateral primary osteoarthritis, unspecified knee: Secondary | ICD-10-CM | POA: Insufficient documentation

## 2019-08-10 ENCOUNTER — Ambulatory Visit: Payer: Medicare HMO | Admitting: Podiatry

## 2019-08-10 ENCOUNTER — Ambulatory Visit (INDEPENDENT_AMBULATORY_CARE_PROVIDER_SITE_OTHER): Payer: Medicare HMO

## 2019-08-10 ENCOUNTER — Encounter: Payer: Self-pay | Admitting: Podiatry

## 2019-08-10 ENCOUNTER — Other Ambulatory Visit: Payer: Self-pay

## 2019-08-10 DIAGNOSIS — S9031XA Contusion of right foot, initial encounter: Secondary | ICD-10-CM

## 2019-08-10 DIAGNOSIS — T8484XA Pain due to internal orthopedic prosthetic devices, implants and grafts, initial encounter: Secondary | ICD-10-CM

## 2019-08-10 NOTE — Progress Notes (Signed)
He presents today chief complaint of an injury that took place about a month ago and injured the second toe which had a screw in it.  He states that the metal trailer came down on the dorsum of his foot where a Lapidus procedure sec metatarsal osteotomy and hammertoe repair had taken place.  He states that everything seems to be okay other than some tenderness on the proximal end of the second metatarsal as well as tenderness to the distal aspect of the toe.  He states that the toe is red and swollen most of the time.  He denies fever chills nausea vomiting muscle aches and pains states that the proximal portion of the second metatarsal has improved considerably.  Objective: Vital signs are stable alert and oriented x3.  Pulses are palpable.  Neurologic sensorium is intact degenerative flexors are intact muscle strength is normal symmetrical.  Orthopedic evaluation demonstrates tenderness on palpation of the proximal second metatarsal.  Mild erythema overlying the DIPJ second digit right foot with a lesion beneath the nail plate.  Radiographs taken today demonstrate a transverse fracture of the base of the second metatarsal which appears to be healing.  He also has a fracture of the distal phalanx with a screw trying to penetrate the nail plate.  Assessment: Painful internal fixation second digit right foot.  Healing fracture proximal metatarsal second right.  Plan: Discussed etiology pathology conservative surgical therapies at this point consented him for screw removal second digit right foot.  Signed Arnell Sieving page of the consent form I will follow-up with him in near future for surgical intervention.

## 2019-08-10 NOTE — Patient Instructions (Signed)
Pre-Operative Instructions  Congratulations, you have decided to take an important step towards improving your quality of life.  You can be assured that the doctors and staff at [facility] will be with you every step of the way.  Here are some important things you should know:  1. Plan to be at the surgery center/hospital at least 1 (one) hour prior to your scheduled time, unless otherwise directed by the surgical center/hospital staff.  You must have a responsible adult accompany you, remain during the surgery and drive you home.  Make sure you have directions to the surgical center/hospital to ensure you arrive on time. 2. If you are having surgery at [facility] or [facility], you will need a copy of your medical history and physical form from your family physician within one month prior to the date of surgery. We will give you a form for your primary physician to complete.  3. We make every effort to accommodate the date you request for surgery.  However, there are times where surgery dates or times have to be moved.  We will contact you as soon as possible if a change in schedule is required.   4. No aspirin/ibuprofen for one week before surgery.  If you are on aspirin, any non-steroidal anti-inflammatory medications (Mobic, Aleve, Ibuprofen) should not be taken seven (7) days prior to your surgery.  You make take Tylenol for pain prior to surgery.  5. Medications - If you are taking daily heart and blood pressure medications, seizure, reflux, allergy, asthma, anxiety, pain or diabetes medications, make sure you notify the surgery center/hospital before the day of surgery so they can tell you which medications you should take or avoid the day of surgery. 6. No food or drink after midnight the night before surgery unless directed otherwise by surgical center/hospital staff. 7. No alcoholic beverages 24-hours prior to surgery.  No smoking 24-hours prior or 24-hours after  surgery. 8. Wear loose pants or shorts. They should be loose enough to fit over bandages, boots, and casts. 9. Don't wear slip-on shoes. Sneakers are preferred. 10. Bring your boot with you to the surgery center/hospital.  Also bring crutches or a walker if your physician has prescribed it for you.  If you do not have this equipment, it will be provided for you after surgery. 11. If you have not been contacted by the surgery center/hospital by the day before your surgery, call to confirm the date and time of your surgery. 12. Leave-time from work may vary depending on the type of surgery you have.  Appropriate arrangements should be made prior to surgery with your employer. 13. Prescriptions will be provided immediately following surgery by your doctor.  Fill these as soon as possible after surgery and take the medication as directed. Pain medications will not be refilled on weekends and must be approved by the doctor. 14. Remove nail polish on the operative foot and avoid getting pedicures prior to surgery. 15. Wash the night before surgery.  The night before surgery wash the foot and leg well with water and the antibacterial soap provided. Be sure to pay special attention to beneath the toenails and in between the toes.  Wash for at least three (3) minutes. Rinse thoroughly with water and dry well with a towel.  Perform this wash unless told not to do so by your physician.  Enclosed: 1 Ice pack (please put in freezer the night before surgery)   1 Hibiclens skin cleaner     Pre-op instructions  If you have any questions regarding the instructions, please do not hesitate to call our office.  [place]: [address], [place], [place] [address] -- [phone]  [place]: [address] [place], [place] [address] -- [phone]  [place]: [address]  , [place] [address] -- [phone]  [place]: [address], [address], [place], [place] [address] -- [phone]  Website:  [email or web address] 

## 2019-08-11 ENCOUNTER — Telehealth: Payer: Self-pay | Admitting: *Deleted

## 2019-08-11 NOTE — Telephone Encounter (Addendum)
"  I was told to call you to schedule my surgery on a Friday for a pin removal."  Do you have a date that you would like?  "I'd like to do it on his next available Friday."  He can do it on September 11 or 18, 2020.  "Let's do it on September 11."  I'll get it scheduled.  Someone from the surgical center will give you a call a day or two prior to your surgery date and will give you your arrival time.  You need to register online through the surgical center's [facility] [employer] Portal.  "I will take care of it."

## 2019-08-13 ENCOUNTER — Telehealth: Payer: Self-pay | Admitting: Podiatry

## 2019-08-13 NOTE — Telephone Encounter (Signed)
This patient called to the office with questions about a COVID test.  He says that he was having upper body pain presently.  He also says he came into contact with a coworker who was in the hospital now with covid.  He was asking if he should receive a COVID test prior to surgery with Dr. Milinda Pointer next Friday.  He presently is not in [place] and not able to go to [facility] to receive the test.  I did tell him that he will be questioned concerning COVID the day of surgery.  Therefore if he thinks he is COVID positive he needs to go and have a COVID test prior to surgery.  Gardiner Barefoot DPM

## 2019-08-16 ENCOUNTER — Telehealth: Payer: Self-pay | Admitting: *Deleted

## 2019-08-16 NOTE — Telephone Encounter (Signed)
DOS 08/19/2019 REMOVAL FIXATION DEEP SCREW 2ND RT - 20680  [employer] MEDICARE: Eligibility Date - Nov 07, 2018   Co-Payment - Surgical  In Network Individual  Place of [place] INCLUDED IN OOP  $200.00 Visit  [place] Benefit Plan Coverage  Network Not Applicable Individual  Plan / Coverage Date Dec 08, 2018 - Dec 08, 2019  Benefit Date Dec 08, 2018 - Dec 08, 2019 $0.00 Calendar Year  - $0.00 Year to Date  $0.00 Remaining    Out of [place] (Stop Loss) - Health Benefit Plan Coverage  In Network Individual  Plan / Coverage Date Dec 08, 2018 - Dec 08, 2019  Benefit Date Dec 08, 2018 - Dec 08, 2019 $4,200.00   In Network Individual  $4,150.00 Remaining   Pre-certification is NOT REQUIRED.

## 2019-08-16 NOTE — Telephone Encounter (Signed)
"  I'm scheduled to have surgery with Dr. Milinda Pointer on Friday.  I went online to complete the registration.  I was able to get everything in except the dropbox for the surgeon would not display Dr. Milinda Pointer.  So I didn't know what to do about that."  I am not familiar with the surgical center's portal.  Don't worry about it, someone from the surgical center will give you a call if they need any information.  "I also don't know the time."  Someone from the surgical center will call you a day or two prior to your surgery date and will give you your arrival time.

## 2019-08-18 ENCOUNTER — Other Ambulatory Visit: Payer: Self-pay | Admitting: Podiatry

## 2019-08-18 DIAGNOSIS — T8484XA Pain due to internal orthopedic prosthetic devices, implants and grafts, initial encounter: Secondary | ICD-10-CM

## 2019-08-18 MED ORDER — ONDANSETRON HCL 4 MG PO TABS: 4.0000 mg | ORAL_TABLET | Freq: Three times a day (TID) | ORAL | 0 refills | Status: DC | PRN

## 2019-08-18 MED ORDER — OXYCODONE-ACETAMINOPHEN 10-325 MG PO TABS: 1.0000 | ORAL_TABLET | Freq: Three times a day (TID) | ORAL | 0 refills | Status: AC | PRN

## 2019-08-18 MED ORDER — CEPHALEXIN 500 MG PO CAPS: 500.0000 mg | ORAL_CAPSULE | Freq: Three times a day (TID) | ORAL | 0 refills | Status: DC

## 2019-08-19 ENCOUNTER — Encounter: Payer: Self-pay | Admitting: Podiatry

## 2019-08-19 DIAGNOSIS — Z4889 Encounter for other specified surgical aftercare: Secondary | ICD-10-CM | POA: Diagnosis not present

## 2019-08-19 DIAGNOSIS — L600 Ingrowing nail: Secondary | ICD-10-CM | POA: Diagnosis not present

## 2019-08-24 ENCOUNTER — Ambulatory Visit (INDEPENDENT_AMBULATORY_CARE_PROVIDER_SITE_OTHER): Payer: Self-pay | Admitting: Podiatry

## 2019-08-24 ENCOUNTER — Encounter: Payer: Self-pay | Admitting: Podiatry

## 2019-08-24 ENCOUNTER — Ambulatory Visit (INDEPENDENT_AMBULATORY_CARE_PROVIDER_SITE_OTHER): Payer: Medicare HMO

## 2019-08-24 ENCOUNTER — Other Ambulatory Visit: Payer: Self-pay

## 2019-08-24 VITALS — BP 134/80 | HR 74 | Temp 98.0°F

## 2019-08-24 DIAGNOSIS — Z9889 Other specified postprocedural states: Secondary | ICD-10-CM

## 2019-08-24 DIAGNOSIS — T8484XA Pain due to internal orthopedic prosthetic devices, implants and grafts, initial encounter: Secondary | ICD-10-CM

## 2019-08-24 NOTE — Progress Notes (Signed)
He presents today date of surgery August 19, 2019 removal screw second toe of the right foot.  States that is doing good pain level is about 2 out of 10.  Objective: Vital signs are stable alert and oriented x3.  There is no erythema edema cellulitis drainage odor sutures intact.  Nail plate was removed during surgery nail bed appears to be healing very nicely.  Radiographs taken today demonstrate complete removal of the screw.  Assessment: Well-healing surgical foot.  Complete removal of screw.  Plan: Redressed today dressed a compressive dressing follow-up with me in 1 week for suture removal.

## 2019-08-31 ENCOUNTER — Other Ambulatory Visit: Payer: Self-pay

## 2019-08-31 ENCOUNTER — Encounter: Payer: Self-pay | Admitting: Podiatry

## 2019-08-31 ENCOUNTER — Ambulatory Visit (INDEPENDENT_AMBULATORY_CARE_PROVIDER_SITE_OTHER): Payer: Self-pay | Admitting: Podiatry

## 2019-08-31 DIAGNOSIS — Z9889 Other specified postprocedural states: Secondary | ICD-10-CM

## 2019-08-31 DIAGNOSIS — T8484XA Pain due to internal orthopedic prosthetic devices, implants and grafts, initial encounter: Secondary | ICD-10-CM

## 2019-08-31 NOTE — Progress Notes (Signed)
He presents today for second postop visit date of surgery 08/19/2019 removal of screw from second toe of the right foot.  States that is doing fine no problems whatsoever.  Objective: Presents today no dressing just a sock over the wound appears to be healing very nicely.  Sutures intact suture was removed today margins remain well coapted and allowed to get this well start washing and cleaning it and I will follow-up with him on an as-needed basis.  Assessment well-healing surgical toe right.  Plan: Follow-up with me as needed suture was removed today.

## 2019-09-14 ENCOUNTER — Other Ambulatory Visit: Payer: Medicare HMO

## 2019-09-28 ENCOUNTER — Other Ambulatory Visit: Payer: Medicare HMO

## 2020-01-21 ENCOUNTER — Ambulatory Visit: Payer: Medicare HMO | Attending: Internal Medicine

## 2020-01-21 DIAGNOSIS — Z23 Encounter for immunization: Secondary | ICD-10-CM | POA: Insufficient documentation

## 2020-01-21 NOTE — Progress Notes (Signed)
   Covid-19 Vaccination Clinic  Name:  Willie Campos    MRN: [id number] DOB: 08-May-1951  01/21/2020  Mr. [name] was observed post Covid-19 immunization for 15 minutes without incidence. He was provided with Vaccine Information Sheet and instruction to access the V-Safe system.   Mr. [name] was instructed to call 911 with any severe reactions post vaccine: Marland Kitchen Difficulty breathing  . Swelling of your face and throat  . A fast heartbeat  . A bad rash all over your body  . Dizziness and weakness    Immunizations Administered    Name Date Dose VIS Date Route   Pfizer COVID-19 Vaccine 01/21/2020  8:15 AM 0.3 mL 11/18/2019 Intramuscular   Manufacturer: [place]   Lot: [id number]   [place]: [id number]

## 2020-02-15 ENCOUNTER — Ambulatory Visit: Payer: Medicare HMO | Attending: Internal Medicine

## 2020-02-15 DIAGNOSIS — Z23 Encounter for immunization: Secondary | ICD-10-CM | POA: Insufficient documentation

## 2020-02-15 NOTE — Progress Notes (Signed)
   Covid-19 Vaccination Clinic  Name:  Willie Campos    MRN: [id number] DOB: [birth year]  02/15/2020  [name] was observed post Covid-19 immunization for 15 minutes without incident. He was provided with Vaccine Information Sheet and instruction to access the V-Safe system.   [name] was instructed to call 911 with any severe reactions post vaccine: Marland Kitchen Difficulty breathing  . Swelling of face and throat  . A fast heartbeat  . A bad rash all over body  . Dizziness and weakness   Immunizations Administered    Name Date Dose VIS Date Route   Pfizer COVID-19 Vaccine 02/15/2020 12:02 PM 0.3 mL 11/18/2019 Intramuscular   Manufacturer: [place]   Lot: [id number]   [place]: [id number]

## 2020-03-05 ENCOUNTER — Ambulatory Visit: Payer: Medicare HMO | Admitting: Podiatry

## 2021-05-22 ENCOUNTER — Other Ambulatory Visit: Payer: Self-pay

## 2021-05-22 ENCOUNTER — Ambulatory Visit: Payer: Medicare HMO | Admitting: Podiatry

## 2021-05-22 ENCOUNTER — Encounter: Payer: Self-pay | Admitting: Podiatry

## 2021-05-22 ENCOUNTER — Ambulatory Visit (INDEPENDENT_AMBULATORY_CARE_PROVIDER_SITE_OTHER): Payer: Medicare HMO

## 2021-05-22 DIAGNOSIS — M722 Plantar fascial fibromatosis: Secondary | ICD-10-CM

## 2021-05-22 MED ORDER — METHYLPREDNISOLONE 4 MG PO TBPK: ORAL_TABLET | ORAL | 0 refills | Status: DC

## 2021-05-22 MED ORDER — TRIAMCINOLONE ACETONIDE 40 MG/ML IJ SUSP
20.0000 mg | Freq: Once | INTRAMUSCULAR | Status: AC
  Administered 2021-05-22: 20 mg

## 2021-06-13 ENCOUNTER — Telehealth: Payer: Self-pay | Admitting: Podiatry

## 2021-06-13 NOTE — Telephone Encounter (Signed)
Pt is currently out of town and he walked down the steps and he didn't have his foot solidly on the step and he has stretch that muscle in the back. He needs some medical advice until he gets back. He is in pain. Please call patient

## 2021-06-14 ENCOUNTER — Telehealth: Payer: Self-pay | Admitting: *Deleted

## 2021-06-14 NOTE — Telephone Encounter (Signed)
Returned call to patient, said that he injured his right foot, went to ER one day ago,diagnosed with a sprain,prescribed prednisone(20 tablets). The pain today is 7 out of 10. If not better when he returns back in town ,will schedule a f/u appointment.

## 2021-07-08 ENCOUNTER — Encounter: Payer: Medicare HMO | Admitting: Podiatry

## 2021-07-17 ENCOUNTER — Ambulatory Visit: Payer: Medicare HMO | Admitting: Podiatry

## 2022-02-02 ENCOUNTER — Emergency Department
Admission: EM | Admit: 2022-02-02 | Discharge: 2022-02-03 | Disposition: A | Discharge status: Home / Self Care | Payer: Medicare HMO | Attending: Emergency Medicine | Admitting: Emergency Medicine

## 2022-02-02 ENCOUNTER — Other Ambulatory Visit: Payer: Self-pay

## 2022-02-02 ENCOUNTER — Emergency Department: Payer: Medicare HMO

## 2022-02-02 DIAGNOSIS — R21 Rash and other nonspecific skin eruption: Secondary | ICD-10-CM | POA: Diagnosis present

## 2022-02-02 DIAGNOSIS — L509 Urticaria, unspecified: Secondary | ICD-10-CM | POA: Diagnosis not present

## 2022-02-02 MED ORDER — DIPHENHYDRAMINE HCL 25 MG PO CAPS
50.0000 mg | ORAL_CAPSULE | Freq: Once | ORAL | Status: AC
Start: 2022-02-02 — End: 2022-02-02
  Administered 2022-02-02: 50 mg via ORAL
  Filled 2022-02-02: qty 2

## 2022-02-02 MED ORDER — FAMOTIDINE 20 MG PO TABS
20.0000 mg | ORAL_TABLET | Freq: Once | ORAL | Status: AC
  Administered 2022-02-02: 20 mg via ORAL
  Filled 2022-02-02: qty 1

## 2022-02-02 MED ORDER — EPINEPHRINE 0.3 MG/0.3ML IJ SOAJ: 0.3000 mg | INTRAMUSCULAR | 1 refills | Status: DC | PRN

## 2022-02-02 NOTE — ED Notes (Signed)
Patient transported to x-ray. ?

## 2022-02-02 NOTE — Discharge Instructions (Addendum)
Please use both Benadryl and famotidine/Pepcid for your rash and hives.  Take 2 tablets, 50 mg, of Benadryl per dose, 3-4 times per day.  You can also use famotidine alongside this once or twice daily.  If you develop any further worsening symptoms despite these medications, inability to breathe or other worsenings, then administer yourself the EpiPen and return to the ED.

## 2022-02-02 NOTE — ED Provider Notes (Signed)
[facility] Provider Note    Event Date/Time   First MD Initiated Contact with Patient 02/02/22 2255     (approximate)   History   Rash   HPI  Willie Campos is a 71 y.o. male who presents to the ED for evaluation of Rash    I reviewed PCP visit from 2/21 where patient was seen for a rash.  Prescribed steroid taper.  Diagnosed with urticaria.  Patient presents to the ED, accompanied by his wife, for evaluation of worsening itchy rash to his armpits, lower abdomen, head and scalp.  Patient reports the sensation of his lips swelling and throat closing.  Denies changes to his voice or difficulty phonating.  Denies difficulty swallowing.  Reports an itchy rash, improved with Benadryl at home.  25 mg per dose.  Reports feeling better when he took famotidine last night, but has not had any today.  His wife has an EpiPen at home, but he has not administered it to himself.  He has an appointment with his PCP already scheduled for tomorrow afternoon.  Patient was having a cough for about 2 weeks, improving without fevers or productive cough.  His wife questions if he has pneumonia.  Physical Exam   Triage Vital Signs: ED Triage Vitals  Enc Vitals Group     BP 02/02/22 2223 124/90     Pulse Rate 02/02/22 2223 89     Resp 02/02/22 2223 18     Temp 02/02/22 2223 99.2 F (37.3 C)     Temp Source 02/02/22 2223 Oral     SpO2 02/02/22 2223 96 %     Weight 02/02/22 2220 200 lb (90.7 kg)     Height 02/02/22 2220 5\' 10"  (1.778 m)     Head Circumference --      Peak Flow --      Pain Score 02/02/22 2220 0     Pain Loc --      Pain Edu? --      Excl. in [place]? --     Most recent vital signs: Vitals:   02/02/22 2223 02/02/22 2300  BP: 124/90 (!) 154/85  Pulse: 89 89  Resp: 18 18  Temp: 99.2 F (37.3 C)   SpO2: 96% 95%    General: Awake, no distress.  Pleasant and conversational in full sentences. CV:  Good peripheral perfusion.  RRR Resp:  Normal effort.   No wheezing Abd:  No distention.  Soft and benign throughout MSK:  No deformity noted.  Neuro:  No focal deficits appreciated. Other:  Urticarial rash to bilateral axilla and lower abdomen/pubic area, with associated excoriations..  No lesions to the palms or soles or mucous membranes. Moist mucous membranes with uvula at midline and no peritonsillar swelling.  Couple tiny punctate white spots to the posterior oropharynx.  No tongue swelling.     ED Results / Procedures / Treatments   Labs (all labs ordered are listed, but only abnormal results are displayed) Labs Reviewed - No data to display  EKG   RADIOLOGY CXR reviewed by me without evidence of acute cardiopulmonary pathology.  Official radiology report(s): DG Chest 2 View  Result Date: 02/03/2022 CLINICAL DATA:  Cough, shortness of breath EXAM: CHEST - 2 VIEW COMPARISON:  None. FINDINGS: Heart and mediastinal contours are within normal limits. No focal opacities or effusions. No acute bony abnormality. IMPRESSION: No active cardiopulmonary disease. Electronically Signed   By: Rolm Baptise M.D.   On: 02/03/2022 00:01  PROCEDURES and INTERVENTIONS:  .1-3 Lead EKG Interpretation Performed by: Vladimir Crofts, MD Authorized by: Vladimir Crofts, MD     Interpretation: normal     ECG rate:  76   ECG rate assessment: normal     Rhythm: sinus rhythm     Ectopy: none     Conduction: normal    Medications  diphenhydrAMINE (BENADRYL) capsule 50 mg (50 mg Oral Given 02/02/22 2343)  famotidine (PEPCID) tablet 20 mg (20 mg Oral Given 02/02/22 2343)     IMPRESSION / MDM / ASSESSMENT AND PLAN / ED COURSE  I reviewed the triage vital signs and the nursing notes.  71 year old male presents to the ED with continued urticarial rash without evidence of anaphylaxis.  Reassuring examination with urticarial rash, but no evidence of anaphylaxis, upper airway obstruction.  No rash to the palms or soles and less likely represent other rashes  such as EM minor.  We will obtain a CXR to assess for infiltrates due to his consistent cough, but I suspect a viral syndrome.  We will administer Benadryl and H2 blocker here in the ED and observe, with expectation of likely outpatient management as long as he does not worsen.  Wrote a prescription for an EpiPen.  Clinical Course as of 02/03/22 0028  Mon Feb 03, 2022  0027 Reassessed.  Patient sitting up on the side of the bed, looks well and is requesting discharge.  We discussed reassuring x-ray.  We discussed indications for an EpiPen administration at home and we discussed dosing of antihistamines.  Answered questions [DS]    Clinical Course User Index [DS] Vladimir Crofts, MD     FINAL CLINICAL IMPRESSION(S) / ED DIAGNOSES   Final diagnoses:  Urticaria     Rx / DC Orders   ED Discharge Orders          Ordered    EPINEPHrine 0.3 mg/0.3 mL IJ SOAJ injection  As needed        02/02/22 2343             Note:  This document was prepared using Dragon voice recognition software and may include unintentional dictation errors.   Vladimir Crofts, MD 02/03/22 [phone]

## 2022-02-02 NOTE — ED Triage Notes (Signed)
Reports has a rash for several weeks. Reports was given prednisone.  States that rash is worse and feels like his throat is closing.  Patient speaking in complete sentences, no obvious respiratory distress noted.

## 2022-02-03 NOTE — ED Notes (Signed)
Pt discharge information reviewed. Pt understands need for follow up care and when to return if symptoms worsen. All questions answered. Pt is alert and oriented with even and regular respirations. Pt is seen ambulating out of department with string steady gait with family.  °

## 2022-02-10 ENCOUNTER — Other Ambulatory Visit: Payer: Self-pay

## 2022-02-10 ENCOUNTER — Observation Stay
Admission: EM | Admit: 2022-02-10 | Discharge: 2022-02-11 | Disposition: A | Discharge status: Home / Self Care | Payer: Medicare HMO | Attending: Internal Medicine | Admitting: Internal Medicine

## 2022-02-10 ENCOUNTER — Encounter: Payer: Self-pay | Admitting: Emergency Medicine

## 2022-02-10 DIAGNOSIS — D72823 Leukemoid reaction: Secondary | ICD-10-CM | POA: Diagnosis not present

## 2022-02-10 DIAGNOSIS — Z20822 Contact with and (suspected) exposure to covid-19: Secondary | ICD-10-CM | POA: Insufficient documentation

## 2022-02-10 DIAGNOSIS — L508 Other urticaria: Secondary | ICD-10-CM

## 2022-02-10 DIAGNOSIS — Z79899 Other long term (current) drug therapy: Secondary | ICD-10-CM | POA: Insufficient documentation

## 2022-02-10 DIAGNOSIS — T783XXA Angioneurotic edema, initial encounter: Secondary | ICD-10-CM | POA: Diagnosis not present

## 2022-02-10 DIAGNOSIS — T7840XD Allergy, unspecified, subsequent encounter: Secondary | ICD-10-CM | POA: Diagnosis not present

## 2022-02-10 DIAGNOSIS — R21 Rash and other nonspecific skin eruption: Secondary | ICD-10-CM | POA: Diagnosis present

## 2022-02-10 DIAGNOSIS — T7840XA Allergy, unspecified, initial encounter: Secondary | ICD-10-CM | POA: Diagnosis present

## 2022-02-10 LAB — BASIC METABOLIC PANEL
Anion gap: 8 (ref 5–15)
BUN: 20 mg/dL (ref 8–23)
CO2: 24 mmol/L (ref 22–32)
Calcium: 8.3 mg/dL — ABNORMAL LOW (ref 8.9–10.3)
Chloride: 107 mmol/L (ref 98–111)
Creatinine, Ser: 0.97 mg/dL (ref 0.61–1.24)
GFR, Estimated: >60 mL/min (ref >60)
Glucose, Bld: 118 mg/dL — ABNORMAL HIGH (ref 70–99)
Potassium: 3.9 mmol/L (ref 3.5–5.1)
Sodium: 139 mmol/L (ref 135–145)

## 2022-02-10 LAB — CBC
HCT: 49.7 % (ref 39.0–52.0)
Hemoglobin: 16 g/dL (ref 13.0–17.0)
MCH: 27 pg (ref 26.0–34.0)
MCHC: 32.2 g/dL (ref 30.0–36.0)
MCV: 83.8 fL (ref 80.0–100.0)
Platelets: 309 10*3/uL (ref 150–400)
RBC: 5.93 MIL/uL — ABNORMAL HIGH (ref 4.22–5.81)
RDW: 15 % (ref 11.5–15.5)
WBC: 16.9 10*3/uL — ABNORMAL HIGH (ref 4.0–10.5)
nRBC: 0 % (ref 0.0–0.2)

## 2022-02-10 LAB — RESP PANEL BY RT-PCR (FLU A&B, COVID) ARPGX2
Influenza A by PCR: NEGATIVE
Influenza B by PCR: NEGATIVE
SARS Coronavirus 2 by RT PCR: NEGATIVE

## 2022-02-10 MED ORDER — ONDANSETRON HCL 4 MG/2ML IJ SOLN: 4.0000 mg | Freq: Four times a day (QID) | INTRAMUSCULAR | Status: DC | PRN

## 2022-02-10 MED ORDER — EPINEPHRINE 0.3 MG/0.3ML IJ SOAJ: 0.3000 mg | INTRAMUSCULAR | 1 refills | Status: DC | PRN

## 2022-02-10 MED ORDER — FAMOTIDINE IN NACL 20-0.9 MG/50ML-% IV SOLN
20.0000 mg | Freq: Once | INTRAVENOUS | Status: AC
  Administered 2022-02-10: 20 mg via INTRAVENOUS
  Filled 2022-02-10: qty 50

## 2022-02-10 MED ORDER — METHYLPREDNISOLONE SODIUM SUCC 125 MG IJ SOLR
125.0000 mg | Freq: Once | INTRAMUSCULAR | Status: AC
  Administered 2022-02-10: 125 mg via INTRAVENOUS
  Filled 2022-02-10: qty 2

## 2022-02-10 MED ORDER — DIPHENHYDRAMINE HCL 50 MG/ML IJ SOLN
25.0000 mg | Freq: Once | INTRAMUSCULAR | Status: AC
Start: 2022-02-10 — End: 2022-02-10
  Administered 2022-02-10: 25 mg via INTRAVENOUS
  Filled 2022-02-10: qty 1

## 2022-02-10 MED ORDER — DIPHENHYDRAMINE HCL 50 MG/ML IJ SOLN: 25.0000 mg | Freq: Three times a day (TID) | INTRAMUSCULAR | Status: AC

## 2022-02-10 MED ORDER — ACETAMINOPHEN 650 MG RE SUPP: 650.0000 mg | Freq: Four times a day (QID) | RECTAL | Status: DC | PRN

## 2022-02-10 MED ORDER — HYDROXYZINE HCL 10 MG PO TABS: 10.0000 mg | ORAL_TABLET | Freq: Three times a day (TID) | ORAL | 0 refills | Status: AC | PRN

## 2022-02-10 MED ORDER — DIPHENHYDRAMINE HCL 50 MG/ML IJ SOLN
25.0000 mg | Freq: Once | INTRAMUSCULAR | Status: AC
  Administered 2022-02-10: 25 mg via INTRAVENOUS
  Filled 2022-02-10: qty 1

## 2022-02-10 MED ORDER — METHYLPREDNISOLONE SODIUM SUCC 40 MG IJ SOLR
40.0000 mg | Freq: Two times a day (BID) | INTRAMUSCULAR | Status: DC
  Administered 2022-02-10 – 2022-02-11 (×2): 40 mg via INTRAVENOUS
  Filled 2022-02-10 (×2): qty 1

## 2022-02-10 MED ORDER — FAMOTIDINE IN NACL 20-0.9 MG/50ML-% IV SOLN
20.0000 mg | Freq: Two times a day (BID) | INTRAVENOUS | Status: AC
  Administered 2022-02-10 – 2022-02-11 (×2): 20 mg via INTRAVENOUS
  Filled 2022-02-10 (×2): qty 50

## 2022-02-10 MED ORDER — EPINEPHRINE 0.3 MG/0.3ML IJ SOAJ
0.3000 mg | INTRAMUSCULAR | Status: DC | PRN
  Filled 2022-02-10: qty 0.3

## 2022-02-10 MED ORDER — ACETAMINOPHEN 325 MG PO TABS: 650.0000 mg | ORAL_TABLET | Freq: Four times a day (QID) | ORAL | Status: DC | PRN

## 2022-02-10 MED ORDER — ONDANSETRON HCL 4 MG PO TABS
4.0000 mg | ORAL_TABLET | Freq: Four times a day (QID) | ORAL | Status: DC | PRN
  Administered 2022-02-10: 4 mg via ORAL
  Filled 2022-02-10: qty 1

## 2022-02-10 MED ORDER — PREDNISONE 10 MG PO TABS: ORAL_TABLET | ORAL | 0 refills | Status: DC

## 2022-02-10 MED ORDER — DIPHENHYDRAMINE HCL 25 MG PO CAPS
25.0000 mg | ORAL_CAPSULE | Freq: Three times a day (TID) | ORAL | Status: AC
  Administered 2022-02-10 – 2022-02-11 (×3): 25 mg via ORAL
  Filled 2022-02-10 (×3): qty 1

## 2022-02-10 NOTE — ED Triage Notes (Signed)
Pt to ED via POV for rash. Pt states that he was seen in the ED last week and was diagnosed with urticarial rash. Pt states that he has been using the medication that he was given and also did a prednisone taper but the rash came back last night. Pt states that he rash from his head down to abdomen. Pt is in NAD.  ?

## 2022-02-10 NOTE — ED Provider Notes (Addendum)
? ?[facility] ?Provider Note ? ? ? Event Date/Time  ? First MD Initiated Contact with Patient 02/10/22 [phone]   ?  (approximate) ? ? ?History  ? ?Rash ? ? ?HPI ? ?Willie Campos is a 70 y.o. male who presents with recurrent rash/urticaria.  Patient reports this is been ongoing for approximately 3 weeks, he initially saw his PCP on February 21 per EMR.  Was treated with Benadryl and prednisone taper.  Was also seen in the emergency department for worsening swelling on 26 February.  Was treated with Benadryl and Pepcid with improvement.  Returns today with similar swelling.  No difficulty breathing but was having some itching around the throat so his wife gave him an EpiPen this morning which she reports made his throat feel better. ?  ? ? ?Physical Exam  ? ?Triage Vital Signs: ?ED Triage Vitals [02/10/22 0835]  ?Enc Vitals Group  ?   BP (!) 142/96  ?   Pulse Rate 97  ?   Resp 18  ?   Temp 98.3 ?F (36.8 ?C)  ?   Temp Source Oral  ?   SpO2 96 %  ?   Weight 90.7 kg (200 lb)  ?   Height 1.778 m ('5\' 10"'$ )  ?   Head Circumference   ?   Peak Flow   ?   Pain Score 0  ?   Pain Loc   ?   Pain Edu?   ?   Excl. in [place]?   ? ? ?Most recent vital signs: ?Vitals:  ? 02/10/22 0835  ?BP: (!) 142/96  ?Pulse: 97  ?Resp: 18  ?Temp: 98.3 ?F (36.8 ?C)  ?SpO2: 96%  ? ? ? ?General: Awake, no distress.  ?CV:  Good peripheral perfusion.  ?Resp:  Normal effort.  ?Abd:  No distention.   ?Pharynx: Normal, normal uvula, no swelling.  No stridor. ?Other:  Skin rash consistent with urticaria, slightly raised diffuse ? ? ?ED Results / Procedures / Treatments  ? ?Labs ?(all labs ordered are listed, but only abnormal results are displayed) ?Labs Reviewed  ?RESP PANEL BY RT-PCR (FLU A&B, COVID) [id number]  ?CBC  ?BASIC METABOLIC PANEL  ? ? ? ?EKG ? ? ? ? ?RADIOLOGY ? ? ? ? ?PROCEDURES: ? ?Critical Care performed: yes ? ?CRITICAL CARE ?Performed by: Lavonia Drafts ? ? ?Total critical care time: 30 minutes ? ?Critical care time was  exclusive of separately billable procedures and treating other patients. ? ?Critical care was necessary to treat or prevent imminent or life-threatening deterioration. ? ?Critical care was time spent personally by me on the following activities: development of treatment plan with patient and/or surrogate as well as nursing, discussions with consultants, evaluation of patient's response to treatment, examination of patient, obtaining history from patient or surrogate, ordering and performing treatments and interventions, ordering and review of laboratory studies, ordering and review of radiographic studies, pulse oximetry and re-evaluation of patient's condition. ? ? ?Procedures ? ? ?MEDICATIONS ORDERED IN ED: ?Medications  ?methylPREDNISolone sodium succinate (SOLU-MEDROL) 125 mg/2 mL injection 125 mg (125 mg Intravenous Given 02/10/22 0925)  ?diphenhydrAMINE (BENADRYL) injection 25 mg (25 mg Intravenous Given 02/10/22 0921)  ?famotidine (PEPCID) IVPB 20 mg premix (0 mg Intravenous Stopped 02/10/22 1039)  ?diphenhydrAMINE (BENADRYL) injection 25 mg (25 mg Intravenous Given 02/10/22 1057)  ? ? ? ?IMPRESSION / MDM / ASSESSMENT AND PLAN / ED COURSE  ?I reviewed the triage vital signs and the nursing notes. ? ?Patient with recurrent  urticaria of unknown etiology.  Will treat with IV Solu-Medrol, IV Benadryl, IV Pepcid and monitor in the emergency department. ? ?Patient initially reported that itching had improved, however after about 1.5 hours, symptoms worsen, rash worsened, started to feel discomfort in his throat again. ? ?Additional dose of IV Benadryl ordered ? ?Patient's labs performed at his PCPs office on February 24 are reassuring, will redraw now as I believe the patient will require admission ? ?Labs reviewed and unremarkable ? ?Discussed with the hospitalist for admission, Dr. Francine Graven has accepted the patient ? ?  ? ? ?FINAL CLINICAL IMPRESSION(S) / ED DIAGNOSES  ? ?Final diagnoses:  ?Recurrent urticaria  ?Allergic  reaction, subsequent encounter  ? ? ? ?Rx / DC Orders  ? ?ED Discharge Orders   ? ?      Ordered  ?  predniSONE (DELTASONE) 10 MG tablet       ? 02/10/22 1024  ?  hydrOXYzine (ATARAX) 10 MG tablet  3 times daily PRN       ? 02/10/22 1024  ?  EPINEPHrine 0.3 mg/0.3 mL IJ SOAJ injection  As needed       ? 02/10/22 1025  ? ?  ?  ? ?  ? ? ? ?Note:  This document was prepared using Dragon voice recognition software and may include unintentional dictation errors. ?  ?Lavonia Drafts, MD ?02/10/22 1100 ? ?  ?Lavonia Drafts, MD ?02/10/22 1546 ? ?

## 2022-02-10 NOTE — ED Notes (Signed)
Hives cleared up. NAD noted. Respirations even and unlabored. No needs expressed at this time ?

## 2022-02-10 NOTE — ED Notes (Signed)
Ambulatory to bathroom. Gait steady. No signs of distress. No hives or itching. Denies difficulty swallowing or breathing.  ?

## 2022-02-10 NOTE — Assessment & Plan Note (Signed)
Secondary to systemic steroids ?

## 2022-02-10 NOTE — Assessment & Plan Note (Signed)
Allergic reaction of unclear etiology ?We will place patient on scheduled Solu-Medrol, IV Benadryl and Pepcid ?Follow-up with allergist as an outpatient ?

## 2022-02-10 NOTE — ED Notes (Signed)
Hives noticed appearing around shoulders and neck with itching. No difficulty breathing. Spouse at bedside. VSS. Medicated per orders. ?

## 2022-02-10 NOTE — H&P (Signed)
?History and Physical  ? ? ?Patient: Willie Campos NTI:[id number] DOB: October 31, 1951 ?DOA: 02/10/2022 ?DOS: the patient was seen and examined on 02/10/2022 ?PCP: Adin Hector, MD  ?Patient coming from: Home ? ?Chief Complaint:  ?Chief Complaint  ?Patient presents with  ? Rash  ? ?HPI: Willie Campos is a 71 y.o. male with medical history significant for dyslipidemia who presents to the ER for evaluation of recurrent urticaria. ?Patient states that he has had symptoms ongoing for about 3 weeks. ?Was initially seen by his primary care provider on February 21 for evaluation of an urticarial rash initially involving his underarms and lower extremities.  He was given a prescription for a prednisone taper and Benadryl which he completed a week ago. ?He states that the rash did not completely resolved but did not get any worse while he was taking the prednisone and as soon as he completed the taper he got worse.  He developed lip and facial swelling and was seen at his primary care provider's office where he received a dose of IV steroid and was sent home on prednisone and Pepcid. ?He was seen in the ED on February 26 and treated with Benadryl and Pepcid with some improvement.  He was then discharged home with a prescription for an EpiPen.   ?His wife states that he has been taking Benadryl around-the-clock to help with the itching.  Over the last 24 hours he has had worsening of the rash which is now involved his face, back, chest abdomen and upper arms.  Rash is pruritic and he has some relief with calamine spray.  His wife notes that in the early hours of the morning he felt his throat was closing up and could not breathe so she administered EpiPen with some improvement in his throat swelling. ?He denies using any new laundry detergents or soap.  No new medications.  His PCP actually advised him to discontinue all his meds for now. ?He has been referred to an allergist by his primary care provider. ?He denies having  any chest pain, no shortness of breath, no nausea, no vomiting, no abdominal pain, no dizziness, no lightheadedness or blurred vision. ?He received a dose of IV Solu-Medrol, Pepcid and Benadryl ?Review of Systems: As mentioned in the history of present illness. All other systems reviewed and are negative. ?Past Medical History:  ?Diagnosis Date  ? High cholesterol   ? ?Past Surgical History:  ?Procedure Laterality Date  ? COLONOSCOPY    ? FOOT SURGERY Right   ? TONSILLECTOMY    ? ?Social History:  reports that he has never smoked. He has never used smokeless tobacco. He reports current alcohol use. He reports that he does not currently use drugs. ? ?Allergies  ?Allergen Reactions  ? Atorvastatin Other (See Comments)  ?  Muscle soreness  ? ? ?Family History  ?Problem Relation Age of Onset  ? Diabetes Father   ? ? ?Prior to Admission medications   ?Medication Sig Start Date End Date Taking? Authorizing Provider  ?hydrOXYzine (ATARAX) 10 MG tablet Take 1 tablet (10 mg total) by mouth 3 (three) times daily as needed. 02/10/22  Yes Lavonia Drafts, MD  ?predniSONE (DELTASONE) 10 MG tablet Take 5 tablets (50 mg total) by mouth daily with breakfast for 4 days, THEN 4 tablets (40 mg total) daily with breakfast for 4 days, THEN 3 tablets (30 mg total) daily with breakfast for 3 days, THEN 2 tablets (20 mg total) daily with breakfast for 3  days, THEN 1 tablet (10 mg total) daily with breakfast for 2 days. 02/10/22 02/26/22 Yes Lavonia Drafts, MD  ?Ascorbic Acid (VITAMIN C PO) Take by mouth daily.    [provider]  ?Cholecalciferol (VITAMIN D PO) Take by mouth daily.    [provider]  ?EPINEPHrine 0.3 mg/0.3 mL IJ SOAJ injection Inject 0.3 mg into the muscle as needed for anaphylaxis. 02/10/22   Lavonia Drafts, MD  ?meloxicam (MOBIC) 15 MG tablet TAKE 1 TABLET BY MOUTH EVERY DAY WITH A MEAL 03/30/19   [provider]  ?rosuvastatin (CRESTOR) 20 MG tablet Take 1 tablet (20 mg total) by mouth daily. 12/30/16    Roselee Nova, MD  ?VITAMIN A PO Take by mouth daily.    [provider]  ? ? ?Physical Exam: ?Vitals:  ? 02/10/22 0835 02/10/22 1118 02/10/22 1120  ?BP: (!) 142/96 118/84   ?Pulse: 97 78 78  ?Resp: '18 12 12  '$ ?Temp: 98.3 ?F (36.8 ?C)    ?TempSrc: Oral    ?SpO2: 96% 98% 98%  ?Weight: 90.7 kg    ?Height: '5\' 10"'$  (1.778 m)    ? ?Physical Exam ?Constitutional:   ?   Appearance: Normal appearance. He is normal weight.  ?   Comments: Urticarial rash involving the face, chest, abdomen, back and upper extremities  ?HENT:  ?   Head: Normocephalic.  ?   Nose: Nose normal.  ?   Mouth/Throat:  ?   Mouth: Mucous membranes are moist.  ?Eyes:  ?   Pupils: Pupils are equal, round, and reactive to light.  ?Cardiovascular:  ?   Rate and Rhythm: Normal rate and regular rhythm.  ?Pulmonary:  ?   Effort: Pulmonary effort is normal.  ?   Breath sounds: Normal breath sounds.  ?Abdominal:  ?   General: Abdomen is flat. Bowel sounds are normal.  ?   Palpations: Abdomen is soft.  ?Musculoskeletal:     ?   General: Normal range of motion.  ?   Cervical back: Normal range of motion and neck supple.  ?Skin: ?   General: Skin is warm and dry.  ?   Comments: Generalized urticarial rash involving the face, back, chest, abdomen  ?Neurological:  ?   General: No focal deficit present.  ?   Mental Status: He is alert and oriented to person, place, and time.  ?Psychiatric:     ?   Mood and Affect: Mood normal.     ?   Behavior: Behavior normal.  ? ? ?Data Reviewed: ?Relevant notes from primary care and specialist visits, past discharge summaries as available in EHR, including Care Everywhere. ?Prior diagnostic testing as pertinent to current admission diagnoses ?Updated medications and problem lists for reconciliation ?ED course, including vitals, labs, imaging, treatment and response to treatment ?Triage notes, nursing and pharmacy notes and ED provider's notes ?Notable results as noted in HPI ?Labs reviewed and patient has marked  leukocytosis most likely related to steroid administration ?There are no new results to review at this time. ? ?Assessment and Plan: ?* Allergic reaction ?Allergic reaction of unclear etiology ?We will place patient on scheduled Solu-Medrol, IV Benadryl and Pepcid ?Follow-up with allergist as an outpatient ? ?Leukemoid reaction ?Secondary to systemic steroids ? ? ? ? ? Advance Care Planning:   Code Status: Full Code  ? ?Consults: None ? ?Family Communication: Greater than 50% of time was spent discussing plan of care with patient at the bedside.  All questions and concerns  have been addressed.  They verbalized understanding and agree with the plan. ? ?Severity of Illness: ?The appropriate patient status for this patient is OBSERVATION. Observation status is judged to be reasonable and necessary in order to provide the required intensity of service to ensure the patient's safety. The patient's presenting symptoms, physical exam findings, and initial radiographic and laboratory data in the context of their medical condition is felt to place them at decreased risk for further clinical deterioration. Furthermore, it is anticipated that the patient will be medically stable for discharge from the hospital within 2 midnights of admission.  ? ?Author: ?Collier Bullock, MD ?02/10/2022 11:56 AM ? ?For on call review [email or web address].  ?

## 2022-02-11 ENCOUNTER — Other Ambulatory Visit: Payer: Self-pay

## 2022-02-11 DIAGNOSIS — T7840XD Allergy, unspecified, subsequent encounter: Secondary | ICD-10-CM | POA: Diagnosis not present

## 2022-02-11 LAB — HIV ANTIBODY (ROUTINE TESTING W REFLEX): HIV Screen 4th Generation wRfx: NONREACTIVE

## 2022-02-11 LAB — CBC
HCT: 46.1 % (ref 39.0–52.0)
Hemoglobin: 14.9 g/dL (ref 13.0–17.0)
MCH: 26.4 pg (ref 26.0–34.0)
MCHC: 32.3 g/dL (ref 30.0–36.0)
MCV: 81.6 fL (ref 80.0–100.0)
Platelets: 323 10*3/uL (ref 150–400)
RBC: 5.65 MIL/uL (ref 4.22–5.81)
RDW: 14.6 % (ref 11.5–15.5)
WBC: 22.5 10*3/uL — ABNORMAL HIGH (ref 4.0–10.5)
nRBC: 0 % (ref 0.0–0.2)

## 2022-02-11 LAB — BASIC METABOLIC PANEL
Anion gap: 8 (ref 5–15)
BUN: 17 mg/dL (ref 8–23)
CO2: 25 mmol/L (ref 22–32)
Calcium: 8.5 mg/dL — ABNORMAL LOW (ref 8.9–10.3)
Chloride: 104 mmol/L (ref 98–111)
Creatinine, Ser: 0.9 mg/dL (ref 0.61–1.24)
GFR, Estimated: >60 mL/min (ref >60)
Glucose, Bld: 149 mg/dL — ABNORMAL HIGH (ref 70–99)
Potassium: 4.4 mmol/L (ref 3.5–5.1)
Sodium: 137 mmol/L (ref 135–145)

## 2022-02-11 MED ORDER — EPINEPHRINE 0.3 MG/0.3ML IJ SOAJ
0.3000 mg | INTRAMUSCULAR | 0 refills | Status: DC | PRN
  Filled 2022-02-11: qty 2, 30d supply, fill #0

## 2022-02-11 MED ORDER — EPINEPHRINE 0.3 MG/0.3ML IJ SOAJ
0.3000 mg | INTRAMUSCULAR | 0 refills | Status: AC | PRN
Start: 2022-02-11 — End: ?
  Filled 2022-02-11 (×2): qty 2, 30d supply, fill #0

## 2022-02-11 MED ORDER — PREDNISONE 10 MG PO TABS
10.0000 mg | ORAL_TABLET | Freq: Every day | ORAL | 0 refills | Status: DC
  Filled 2022-02-11: qty 30, 30d supply, fill #0

## 2022-02-11 MED ORDER — PREDNISONE 10 MG PO TABS
ORAL_TABLET | ORAL | 0 refills | Status: AC
  Filled 2022-02-11: qty 53, 16d supply, fill #0

## 2022-02-11 MED ORDER — DIPHENHYDRAMINE HCL 50 MG PO TABS
50.0000 mg | ORAL_TABLET | Freq: Four times a day (QID) | ORAL | 0 refills | Status: AC | PRN
  Filled 2022-02-11: qty 30, 8d supply, fill #0

## 2022-02-11 MED ORDER — FEXOFENADINE HCL 180 MG PO TABS
180.0000 mg | ORAL_TABLET | Freq: Every day | ORAL | 0 refills | Status: AC
Start: 2022-02-11 — End: 2022-03-13
  Filled 2022-02-11: qty 30, 30d supply, fill #0

## 2022-02-11 MED ORDER — DIPHENHYDRAMINE HCL 50 MG PO TABS
50.0000 mg | ORAL_TABLET | Freq: Four times a day (QID) | ORAL | 0 refills | Status: DC | PRN
  Filled 2022-02-11: qty 30, 8d supply, fill #0

## 2022-02-11 MED ORDER — FEXOFENADINE HCL 180 MG PO TABS
180.0000 mg | ORAL_TABLET | Freq: Every day | ORAL | 0 refills | Status: DC
  Filled 2022-02-11: qty 30, 30d supply, fill #0

## 2022-02-11 NOTE — Discharge Summary (Signed)
Discharge Summary  Willie Campos [id number] DOB: September 18, 1951  PCP: Adin Hector, MD  Admit date: 02/10/2022 Discharge date: 02/11/2022  Time spent: 35 minutes.  Recommendations for Outpatient Follow-up:  Follow-up with allergy specialist in 1 to 2 weeks. Follow-up with ENT, keep your appointment on 02/13/2022 at 9:30 AM with Dr. Tami Ribas. Follow-up with your primary care provider in 1 to 2 weeks. Take your medications as prescribed.  Discharge Diagnoses:  Active Hospital Problems   Diagnosis Date Noted   Allergic reaction 02/10/2022    Priority: 1.   Leukemoid reaction 02/10/2022    Resolved Hospital Problems  No resolved problems to display.    Discharge Condition: Stable.  Diet recommendation: Resume previous diet.  Vitals:   02/11/22 0745 02/11/22 1120  BP: 118/85 118/71  Pulse: 68 92  Resp: 16 18  Temp: 98.2 F (36.8 C) 98.5 F (36.9 C)  SpO2: 98% 97%    History of present illness:  Willie Campos is a 70 y.o. male with medical history significant for dyslipidemia who presents to [facility] ED for evaluation of recurrent urticaria with initial onset 3 weeks ago and angioedema of unknown etiology.  His wife notes that in the early hours of the morning of his presentation, he felt his throat was closing up and could not breathe so she administered EpiPen with some improvement in his throat swelling.  Patient was brought in to the ED for further evaluation and management of his symptomatology.  He was noted to have a rash.  Over the last 24 hours he had worsening of the rash which involved his face, back, chest, abdomen and upper arms.  Rash was described as pruritic and relieved by calamine spray.  No use of new laundry detergents or soap.  No new medications.  His PCP advised him to discontinue all his meds for now.  Referred to an allergist, Dr. Orvil Feil, by his primary care provider.  He had an appointment on 1 March, 2023 which he canceled.  Discussed with allergist  Dr. Orvil Feil, and via telephone, she recommended close follow-up with his primary care provider.  She will see him in the office after his discharge from the hospital, she is currently booked, however she will fit him into her cancellation calendar.    During this hospitalization he received IV Solu-Medrol, Pepcid, and Benadryl with resolution of his symptomatology.  02/11/2022: Patient was seen and examined at his bedside.  He denies having any dysphagia or dyspnea.  No chest pain.  His wife is present in the room.  His rash has completely resolved.  He is eager to go home.  Endorses an upcoming trip on Friday, going on a cruise for 1 week.  EpiPen, prednisone taper, Allegra, and Benadryl, ordered to be taken prior to his follow-up appointment with his primary care provider, ENT, and allergist.  Hospital Course:  Principal Problem:   Allergic reaction Active Problems:   Leukemoid reaction  Allergic reaction/recurrent urticaria, angioedema of unknown etiology Received scheduled Solu-Medrol, IV Benadryl and Pepcid with resolution of symptomatology. Follow-up with allergist as an outpatient, ENT, and your PCP. Discharged home with EpiPen, prednisone taper, Allegra, and Benadryl.   Leukocytosis, reactive  Secondary to systemic steroids No evidence of active infective process Afebrile, vital signs are stable, nonseptic appearing.   Hyperlipidemia Home Crestor held by his PCP due to recurrent allergic reactions of unclear etiology Defer to PCP to restart.     Code Status: Full Code    Consults: Discussed  with allergist, Dr. Orvil Feil, [phone], on the phone on 02/11/2022.   Family Communication: Updated his wife at bedside.    Discharge Exam: BP 118/71 (BP Location: Left Arm)    Pulse 92    Temp 98.5 F (36.9 C)    Resp 18    Ht '5\' 10"'$  (1.778 m)    Wt 90.7 kg    SpO2 97%    BMI 28.70 kg/m  General: 71 y.o. year-old male well developed well nourished in no acute distress.  Alert and  oriented x3. Cardiovascular: Regular rate and rhythm with no rubs or gallops.  No thyromegaly or JVD noted.   Respiratory: Clear to auscultation with no wheezes or rales. Good inspiratory effort. Abdomen: Soft nontender nondistended with normal bowel sounds x4 quadrants. Musculoskeletal: No lower extremity edema. 2/4 pulses in all 4 extremities. Skin: No ulcerative lesions noted or rashes, Psychiatry: Mood is appropriate for condition and setting  Discharge Instructions You were cared for by a hospitalist during your hospital stay. If you have any questions about your discharge medications or the care you received while you were in the hospital after you are discharged, you can call the unit and asked to speak with the hospitalist on call if the hospitalist that took care of you is not available. Once you are discharged, your primary care physician will handle any further medical issues. Please note that NO REFILLS for any discharge medications will be authorized once you are discharged, as it is imperative that you return to your primary care physician (or establish a relationship with a primary care physician if you do not have one) for your aftercare needs so that they can reassess your need for medications and monitor your lab values.   Allergies as of 02/11/2022       Reactions   Atorvastatin Other (See Comments)   Muscle soreness        Medication List     STOP taking these medications    meloxicam 15 MG tablet Commonly known as: MOBIC   rosuvastatin 20 MG tablet Commonly known as: CRESTOR   VITAMIN A PO   VITAMIN C PO   VITAMIN D PO       TAKE these medications    diphenhydrAMINE 50 MG tablet Commonly known as: BENADRYL Take 1 tablet (50 mg total) by mouth every 6 (six) hours as needed for allergies.   EPINEPHrine 0.3 mg/0.3 mL Soaj injection Commonly known as: EPI-PEN Inject 0.3 mg into the muscle as needed for anaphylaxis.   fexofenadine 180 MG  tablet Commonly known as: ALLEGRA Take 1 tablet (180 mg total) by mouth daily.   hydrOXYzine 10 MG tablet Commonly known as: ATARAX Take 1 tablet (10 mg total) by mouth 3 (three) times daily as needed.   predniSONE 10 MG tablet Commonly known as: DELTASONE Take 5 tablets (50 mg total) by mouth daily with breakfast for 4 days, THEN 4 tablets (40 mg total) daily with breakfast for 4 days, THEN 3 tablets (30 mg total) daily with breakfast for 3 days, THEN 2 tablets (20 mg total) daily with breakfast for 3 days, THEN 1 tablet (10 mg total) daily with breakfast for 2 days. Start taking on: February 10, 2022       Allergies  Allergen Reactions   Atorvastatin Other (See Comments)    Muscle soreness    Follow-up Information     Schedule an appointment as soon as possible for a visit  with Tiajuana Amass, MD.  Specialty: Allergy and Immunology Contact information: [facility] [address] [place] [address] [phone]                  The results of significant diagnostics from this hospitalization (including imaging, microbiology, ancillary and laboratory) are listed below for reference.    Significant Diagnostic Studies: DG Chest 2 View  Result Date: 02/03/2022 CLINICAL DATA:  Cough, shortness of breath EXAM: CHEST - 2 VIEW COMPARISON:  None. FINDINGS: Heart and mediastinal contours are within normal limits. No focal opacities or effusions. No acute bony abnormality. IMPRESSION: No active cardiopulmonary disease. Electronically Signed   By: Rolm Baptise M.D.   On: 02/03/2022 00:01    Microbiology: Recent Results (from the past 240 hour(s))  Resp Panel by RT-PCR (Flu A&B, Covid) Nasopharyngeal Swab     Status: None   Collection Time: 02/10/22 11:06 AM   Specimen: Nasopharyngeal Swab; Nasopharyngeal(NP) swabs in vial transport medium  Result Value Ref Range Status   SARS Coronavirus 2 by RT PCR NEGATIVE NEGATIVE Final    Comment:  (NOTE) SARS-CoV-2 target nucleic acids are NOT DETECTED.  The SARS-CoV-2 RNA is generally detectable in upper respiratory specimens during the acute phase of infection. The lowest concentration of SARS-CoV-2 viral copies this assay can detect is 138 copies/mL. A negative result does not preclude SARS-Cov-2 infection and should not be used as the sole basis for treatment or other patient management decisions. A negative result may occur with  improper specimen collection/handling, submission of specimen other than nasopharyngeal swab, presence of viral mutation(s) within the areas targeted by this assay, and inadequate number of viral copies(<138 copies/mL). A negative result must be combined with clinical observations, patient history, and epidemiological information. The expected result is Negative.  Fact Sheet for Patients:  EntrepreneurPulse.com.au  Fact Sheet for Healthcare Providers:  IncredibleEmployment.be  This test is no t yet approved or cleared by the [place] FDA and  has been authorized for detection and/or diagnosis of SARS-CoV-2 by FDA under an Emergency Use Authorization (EUA). This EUA will remain  in effect (meaning this test can be used) for the duration of the COVID-19 declaration under Section 564(b)(1) of the Act, 21 U.S.C.section 360bbb-3(b)(1), unless the authorization is terminated  or revoked sooner.       Influenza A by PCR NEGATIVE NEGATIVE Final   Influenza B by PCR NEGATIVE NEGATIVE Final    Comment: (NOTE) The Xpert Xpress SARS-CoV-2/FLU/RSV plus assay is intended as an aid in the diagnosis of influenza from Nasopharyngeal swab specimens and should not be used as a sole basis for treatment. Nasal washings and aspirates are unacceptable for Xpert Xpress SARS-CoV-2/FLU/RSV testing.  Fact Sheet for Patients: EntrepreneurPulse.com.au  Fact Sheet for Healthcare  Providers: IncredibleEmployment.be  This test is not yet approved or cleared by the [place] FDA and has been authorized for detection and/or diagnosis of SARS-CoV-2 by FDA under an Emergency Use Authorization (EUA). This EUA will remain in effect (meaning this test can be used) for the duration of the COVID-19 declaration under Section 564(b)(1) of the Act, 21 U.S.C. section 360bbb-3(b)(1), unless the authorization is terminated or revoked.  Performed at [facility], [address] [place], [place] [address]      Labs: Basic Metabolic Panel: Recent Labs  Lab 02/10/22 1111 02/11/22 0343  NA 139 137  K 3.9 4.4  CL 107 104  CO2 24 25  GLUCOSE 118* 149*  BUN 20 17  CREATININE 0.97 0.90  CALCIUM 8.3* 8.5*   Liver Function Tests: No results for input(s): AST, ALT, ALKPHOS, BILITOT, PROT, ALBUMIN in the last 168 hours. No results for input(s): LIPASE, AMYLASE in the last 168 hours. No results for input(s): AMMONIA in the last 168 hours. CBC: Recent Labs  Lab 02/10/22 1111 02/11/22 0343  WBC 16.9* 22.5*  HGB 16.0 14.9  HCT 49.7 46.1  MCV 83.8 81.6  PLT 309 323   Cardiac Enzymes: No results for input(s): CKTOTAL, CKMB, CKMBINDEX, TROPONINI in the last 168 hours. BNP: BNP (last 3 results) No results for input(s): BNP in the last 8760 hours.  ProBNP (last 3 results) No results for input(s): PROBNP in the last 8760 hours.  CBG: No results for input(s): GLUCAP in the last 168 hours.     Signed:  Kayleen Memos, MD [facility] 02/11/2022, 1:28 PM

## 2022-02-11 NOTE — ED Notes (Signed)
Pt sleeping at this time. NAD noted, respirations even and unlabored. °

## 2022-02-11 NOTE — Plan of Care (Signed)

## 2022-02-11 NOTE — Plan of Care (Signed)

## 2023-09-07 ENCOUNTER — Ambulatory Visit: Payer: Medicare HMO

## 2023-09-07 DIAGNOSIS — K222 Esophageal obstruction: Secondary | ICD-10-CM | POA: Diagnosis not present

## 2023-09-07 DIAGNOSIS — K2100 Gastro-esophageal reflux disease with esophagitis, without bleeding: Secondary | ICD-10-CM | POA: Diagnosis present

## 2023-09-07 DIAGNOSIS — K3189 Other diseases of stomach and duodenum: Secondary | ICD-10-CM | POA: Diagnosis not present

## 2023-09-07 DIAGNOSIS — K449 Diaphragmatic hernia without obstruction or gangrene: Secondary | ICD-10-CM | POA: Diagnosis not present

## 2024-02-16 IMAGING — CR DG CHEST 2V
2 series · 2 of 2 positions shown · non-contrast
Comparison: None.

CLINICAL DATA: Cough, shortness of breath

EXAM:
CHEST - 2 VIEW

[chest pa]
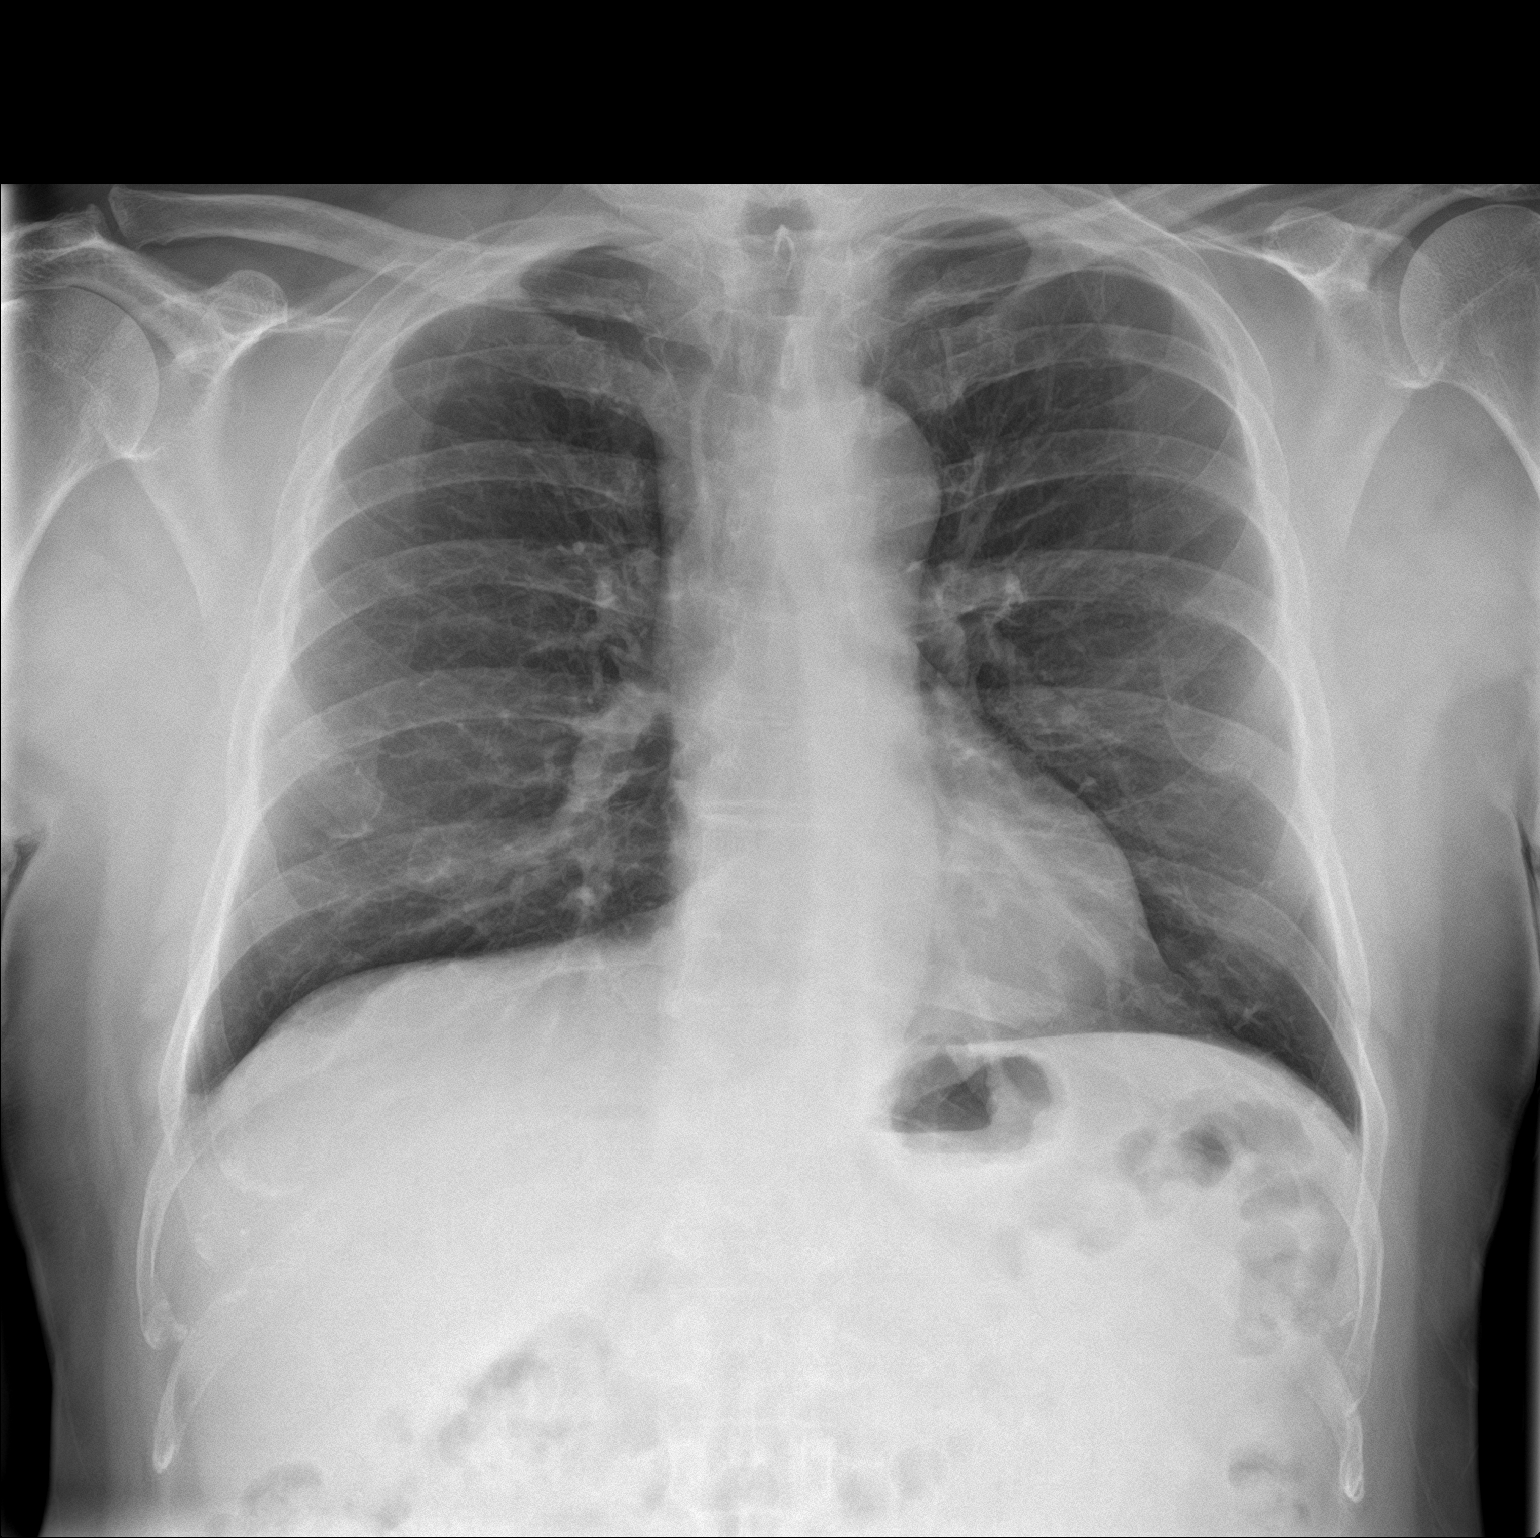

[chest lat]
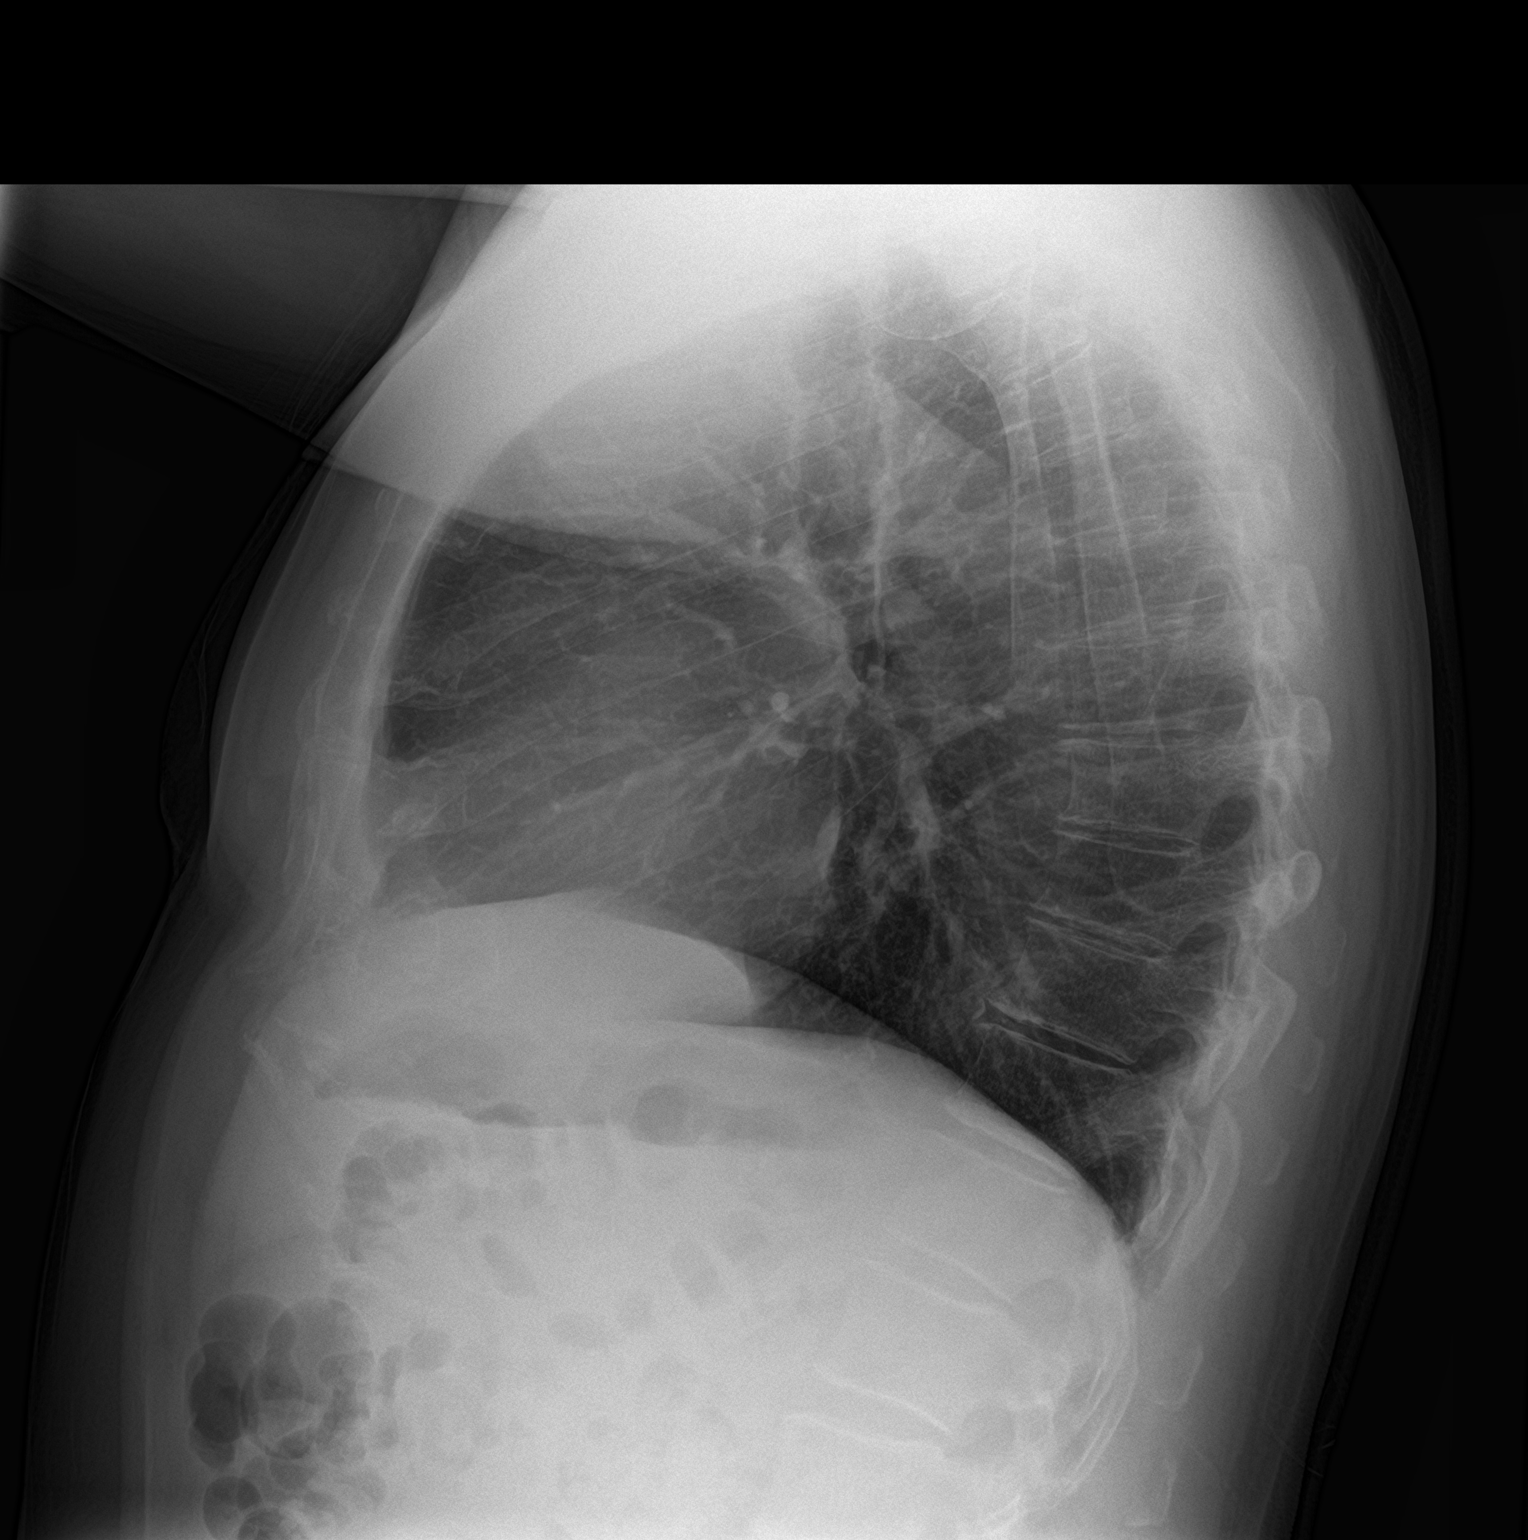

[2 of 2 positions shown; findings below may reference images not displayed]

FINDINGS: Heart and mediastinal contours are within normal limits. No focal
opacities or effusions. No acute bony abnormality.
IMPRESSION: No active cardiopulmonary disease.

## 2024-03-28 ENCOUNTER — Encounter (HOSPITAL_BASED_OUTPATIENT_CLINIC_OR_DEPARTMENT_OTHER): Payer: Self-pay | Admitting: Plastic Surgery

## 2024-03-30 NOTE — H&P (Signed)
 Subjective Patient ID: Willie Campos is a 73 y.o. male.     HPI   Presents for evaluation upper eyelids. Accompanied by his wife who is my patient. Complains of difficulty seeing and needing to pull eyelid/brows up to see that affect ADLs including reading and driving. Reports this has been progressive over years. No glasses or contacts. Denies dry eyes or tearing. No history LASIX. Following by Dr. Alto Atta for eyes.   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   Review of Systems  All other systems reviewed and are negative.     Objective Physical Exam  Cardiovascular: Normal rate, regular rhythm and normal heart sounds.    Pulmonary/Chest Effort normal and breath sounds normal.    Skin   Fitzpatrick 2    HEENT: with frontalis blocked, bilateral dermatochalasis present with upper eyelid skin extending over eyelashes bilateral -Bells, EOMI, pupils 4 to 2 mm bilateral Levator excursion > 12 mm bilateral "Pseudo" ptosis with "pseudo-MRD" < 2 mm    Assessment/Plan Dermatochalasis of both upper eyelids   VFE  demonstrated clinically significant impairment of upper/outer visual fields <30 degrees from fixation noted on untaped exam. Visual fields extended >15 degrees by taping/raising the redundant upper eyelid tissue.   Reviewed upper blepharoplasty incisions. Discussed OP surgery sutures bruising post op limitations. Reviewed with aging brows will continue to descend and may need additional procedures. Reviewed risks including dry eyes and blindness.    Additional risks including but not limited to bleeding asymmetry infection wound healing problems unacceptable cosmetic result damage to adjacent structures blood clots in legs or lungs reviewed. Completed ASPS consent for blepharoplasty.    Rx for tramadol given.  Alger Infield, MD [facility]  Office/ physician access line after hours [phone]

## 2024-04-05 ENCOUNTER — Encounter (HOSPITAL_BASED_OUTPATIENT_CLINIC_OR_DEPARTMENT_OTHER): Payer: Self-pay | Admitting: Plastic Surgery

## 2024-04-05 ENCOUNTER — Ambulatory Visit (HOSPITAL_BASED_OUTPATIENT_CLINIC_OR_DEPARTMENT_OTHER): Payer: Self-pay | Admitting: Anesthesiology

## 2024-04-05 ENCOUNTER — Ambulatory Visit (HOSPITAL_BASED_OUTPATIENT_CLINIC_OR_DEPARTMENT_OTHER)
Admission: RE | Admit: 2024-04-05 | Discharge: 2024-04-05 | Disposition: A | Discharge status: Home / Self Care | Attending: Plastic Surgery | Admitting: Plastic Surgery

## 2024-04-05 ENCOUNTER — Encounter (HOSPITAL_BASED_OUTPATIENT_CLINIC_OR_DEPARTMENT_OTHER)
Admission: RE | Disposition: A | Discharge status: Home / Self Care | Payer: Self-pay | Source: Home / Self Care | Attending: Plastic Surgery

## 2024-04-05 DIAGNOSIS — H02834 Dermatochalasis of left upper eyelid: Secondary | ICD-10-CM | POA: Diagnosis present

## 2024-04-05 DIAGNOSIS — H02831 Dermatochalasis of right upper eyelid: Secondary | ICD-10-CM | POA: Diagnosis present

## 2024-04-05 DIAGNOSIS — M1990 Unspecified osteoarthritis, unspecified site: Secondary | ICD-10-CM | POA: Insufficient documentation

## 2024-04-05 DIAGNOSIS — Z01818 Encounter for other preprocedural examination: Secondary | ICD-10-CM

## 2024-04-05 DIAGNOSIS — K219 Gastro-esophageal reflux disease without esophagitis: Secondary | ICD-10-CM | POA: Insufficient documentation

## 2024-04-05 HISTORY — PX: BROW LIFT: SHX178

## 2024-04-05 HISTORY — DX: Unspecified osteoarthritis, unspecified site: M19.90

## 2024-04-05 HISTORY — DX: Gastro-esophageal reflux disease without esophagitis: K21.9

## 2024-04-05 SURGERY — BLEPHAROPLASTY
Anesthesia: General | Site: Eye | Laterality: Bilateral

## 2024-04-05 MED ORDER — DEXAMETHASONE SODIUM PHOSPHATE 4 MG/ML IJ SOLN
INTRAMUSCULAR | Status: DC | PRN
Start: 2024-04-05 — End: 2024-04-05
  Administered 2024-04-05: 5 mg via INTRAVENOUS

## 2024-04-05 MED ORDER — LIDOCAINE HCL (CARDIAC) PF 100 MG/5ML IV SOSY
PREFILLED_SYRINGE | INTRAVENOUS | Status: DC | PRN
  Administered 2024-04-05: 60 mg via INTRAVENOUS

## 2024-04-05 MED ORDER — PROPOFOL 10 MG/ML IV BOLUS
INTRAVENOUS | Status: AC
  Filled 2024-04-05: qty 20

## 2024-04-05 MED ORDER — LIDOCAINE-EPINEPHRINE 1 %-1:100000 IJ SOLN
INTRAMUSCULAR | Status: DC | PRN
  Administered 2024-04-05: 6 mL

## 2024-04-05 MED ORDER — PHENYLEPHRINE HCL (PRESSORS) 10 MG/ML IV SOLN
INTRAVENOUS | Status: DC | PRN
  Administered 2024-04-05 (×4): 80 ug via INTRAVENOUS

## 2024-04-05 MED ORDER — MIDAZOLAM HCL 2 MG/2ML IJ SOLN
INTRAMUSCULAR | Status: AC
  Filled 2024-04-05: qty 2

## 2024-04-05 MED ORDER — BUPIVACAINE-EPINEPHRINE (PF) 0.25% -1:200000 IJ SOLN
INTRAMUSCULAR | Status: AC
  Filled 2024-04-05: qty 30

## 2024-04-05 MED ORDER — FENTANYL CITRATE (PF) 100 MCG/2ML IJ SOLN
INTRAMUSCULAR | Status: AC
  Filled 2024-04-05: qty 2

## 2024-04-05 MED ORDER — MIDAZOLAM HCL 5 MG/5ML IJ SOLN
INTRAMUSCULAR | Status: DC | PRN
  Administered 2024-04-05: 2 mg via INTRAVENOUS

## 2024-04-05 MED ORDER — FENTANYL CITRATE (PF) 100 MCG/2ML IJ SOLN
INTRAMUSCULAR | Status: DC | PRN
  Administered 2024-04-05 (×2): 50 ug via INTRAVENOUS

## 2024-04-05 MED ORDER — PHENYLEPHRINE 80 MCG/ML (10ML) SYRINGE FOR IV PUSH (FOR BLOOD PRESSURE SUPPORT)
PREFILLED_SYRINGE | INTRAVENOUS | Status: AC
  Filled 2024-04-05: qty 10

## 2024-04-05 MED ORDER — BSS IO SOLN
INTRAOCULAR | Status: DC | PRN
  Administered 2024-04-05: 15 mL

## 2024-04-05 MED ORDER — TOBRAMYCIN-DEXAMETHASONE 0.3-0.1 % OP OINT
TOPICAL_OINTMENT | OPHTHALMIC | Status: AC
  Filled 2024-04-05: qty 3.5

## 2024-04-05 MED ORDER — ROCURONIUM BROMIDE 100 MG/10ML IV SOLN
INTRAVENOUS | Status: DC | PRN
  Administered 2024-04-05: 40 mg via INTRAVENOUS
  Administered 2024-04-05: 20 mg via INTRAVENOUS

## 2024-04-05 MED ORDER — FENTANYL CITRATE (PF) 100 MCG/2ML IJ SOLN: 25.0000 ug | INTRAMUSCULAR | Status: DC | PRN

## 2024-04-05 MED ORDER — CEFAZOLIN SODIUM-DEXTROSE 2-4 GM/100ML-% IV SOLN
INTRAVENOUS | Status: AC
  Filled 2024-04-05: qty 100

## 2024-04-05 MED ORDER — LIDOCAINE-EPINEPHRINE 1 %-1:100000 IJ SOLN
INTRAMUSCULAR | Status: AC
  Filled 2024-04-05: qty 1

## 2024-04-05 MED ORDER — ONDANSETRON HCL 4 MG/2ML IJ SOLN
INTRAMUSCULAR | Status: DC | PRN
  Administered 2024-04-05: 4 mg via INTRAVENOUS

## 2024-04-05 MED ORDER — SUGAMMADEX SODIUM 200 MG/2ML IV SOLN
INTRAVENOUS | Status: DC | PRN
  Administered 2024-04-05: 200 mg via INTRAVENOUS

## 2024-04-05 MED ORDER — PROPOFOL 10 MG/ML IV BOLUS
INTRAVENOUS | Status: DC | PRN
  Administered 2024-04-05: 120 mg via INTRAVENOUS

## 2024-04-05 MED ORDER — ACETAMINOPHEN 500 MG PO TABS: 1000.0000 mg | ORAL_TABLET | Freq: Once | ORAL | Status: DC

## 2024-04-05 MED ORDER — CEFAZOLIN SODIUM-DEXTROSE 2-4 GM/100ML-% IV SOLN
2.0000 g | INTRAVENOUS | Status: AC
  Administered 2024-04-05: 2 g via INTRAVENOUS

## 2024-04-05 MED ORDER — DEXAMETHASONE SODIUM PHOSPHATE 10 MG/ML IJ SOLN
INTRAMUSCULAR | Status: AC
  Filled 2024-04-05: qty 1

## 2024-04-05 MED ORDER — ONDANSETRON HCL 4 MG/2ML IJ SOLN
INTRAMUSCULAR | Status: AC
  Filled 2024-04-05: qty 2

## 2024-04-05 MED ORDER — BSS IO SOLN
INTRAOCULAR | Status: AC
  Filled 2024-04-05: qty 15

## 2024-04-05 MED ORDER — DEXMEDETOMIDINE HCL IN NACL 80 MCG/20ML IV SOLN
INTRAVENOUS | Status: AC
  Filled 2024-04-05: qty 20

## 2024-04-05 MED ORDER — LACTATED RINGERS IV SOLN: INTRAVENOUS | Status: DC

## 2024-04-05 MED ORDER — LIDOCAINE 2% (20 MG/ML) 5 ML SYRINGE
INTRAMUSCULAR | Status: AC
  Filled 2024-04-05: qty 5

## 2024-04-05 MED ORDER — TOBRAMYCIN 0.3 % OP OINT
TOPICAL_OINTMENT | OPHTHALMIC | Status: DC | PRN
  Administered 2024-04-05: 1 via OPHTHALMIC

## 2024-04-05 SURGICAL SUPPLY — 39 items
APPLICATOR COTTON TIP 6 STRL (MISCELLANEOUS) IMPLANT
APPLICATOR COTTON TIP 6IN STRL (MISCELLANEOUS) IMPLANT
APPLICATOR DR MATTHEWS STRL (MISCELLANEOUS) ×1 IMPLANT
BLADE SURG 15 STRL LF DISP TIS (BLADE) ×1 IMPLANT
BNDG EYE OVAL 2 1/8 X 2 5/8 (GAUZE/BANDAGES/DRESSINGS) IMPLANT
CORD BIPOLAR FORCEPS 12FT (ELECTRODE) IMPLANT
COVER BACK TABLE 60X90IN (DRAPES) ×1 IMPLANT
COVER MAYO STAND STRL (DRAPES) ×1 IMPLANT
DERMABOND ADVANCED .7 DNX12 (GAUZE/BANDAGES/DRESSINGS) IMPLANT
DRAPE U-SHAPE 76X120 STRL (DRAPES) ×1 IMPLANT
DRAPE UTILITY XL STRL (DRAPES) IMPLANT
ELECT NDL BLADE 2-5/6 (NEEDLE) ×1 IMPLANT
ELECT NEEDLE BLADE 2-5/6 (NEEDLE) ×1 IMPLANT
ELECTRODE REM PT RTRN 9FT ADLT (ELECTROSURGICAL) ×1 IMPLANT
GLOVE BIO SURGEON STRL SZ 6 (GLOVE) ×1 IMPLANT
GLOVE BIOGEL PI IND STRL 7.0 (GLOVE) IMPLANT
GLOVE BIOGEL PI IND STRL 7.5 (GLOVE) IMPLANT
GLOVE SURG SS PI 7.0 STRL IVOR (GLOVE) IMPLANT
GOWN STRL REUS W/ TWL LRG LVL3 (GOWN DISPOSABLE) ×2 IMPLANT
NDL HYPO 30GX1 BEV (NEEDLE) IMPLANT
NDL PRECISIONGLIDE 27X1.5 (NEEDLE) IMPLANT
NEEDLE HYPO 30GX1 BEV (NEEDLE) IMPLANT
NEEDLE PRECISIONGLIDE 27X1.5 (NEEDLE) ×1 IMPLANT
PACK BASIN DAY SURGERY FS (CUSTOM PROCEDURE TRAY) ×1 IMPLANT
PENCIL SMOKE EVACUATOR (MISCELLANEOUS) ×1 IMPLANT
SHIELD EYE MED CORNL SHD 22X21 (OPHTHALMIC RELATED) ×1 IMPLANT
SLEEVE SCD COMPRESS KNEE MED (STOCKING) ×1 IMPLANT
SPIKE FLUID TRANSFER (MISCELLANEOUS) IMPLANT
STAPLER SKIN PROX WIDE 3.9 (STAPLE) IMPLANT
STRIP CLOSURE SKIN 1/2X4 (GAUZE/BANDAGES/DRESSINGS) ×1 IMPLANT
SUT MERSILENE 4 0 P 3 (SUTURE) IMPLANT
SUT MNCRL 6-0 UNDY P1 1X18 (SUTURE) IMPLANT
SUT PROLENE 6 0 P 1 18 (SUTURE) IMPLANT
SUT PROLENE 6 0 PC 1 (SUTURE) IMPLANT
SUT VIC AB 5-0 P-3 18X BRD (SUTURE) IMPLANT
SUT VICRYL RAPIDE 4-0 (SUTURE) IMPLANT
SYR CONTROL 10ML LL (SYRINGE) ×1 IMPLANT
TOWEL GREEN STERILE FF (TOWEL DISPOSABLE) ×1 IMPLANT
TRAY DSU PREP LF (CUSTOM PROCEDURE TRAY) ×1 IMPLANT

## 2024-04-05 NOTE — Anesthesia Postprocedure Evaluation (Signed)
 Anesthesia Post Note  Patient: Willie Campos  Procedure(s) Performed: BILATERAL UPPER BLEPHAROPLASTY (Bilateral: Eye)     Patient location during evaluation: PACU Anesthesia Type: General Level of consciousness: awake and alert Pain management: pain level controlled Vital Signs Assessment: post-procedure vital signs reviewed and stable Respiratory status: spontaneous breathing, nonlabored ventilation and respiratory function stable Cardiovascular status: blood pressure returned to baseline and stable Postop Assessment: no apparent nausea or vomiting Anesthetic complications: no  No notable events documented.  Last Vitals:  Vitals:   04/05/24 0900 04/05/24 0913  BP: (!) 134/97 123/89  Pulse: 62 72  Resp: 13 16  Temp:  36.6 C  SpO2: 95% 95%    Last Pain:  Vitals:   04/05/24 0913  TempSrc:   PainSc: 0-No pain                 Elbia Paro,W. EDMOND

## 2024-04-05 NOTE — Transfer of Care (Signed)
 Immediate Anesthesia Transfer of Care Note  Patient: Willie Campos  Procedure(s) Performed: Procedure(s) (LRB): BILATERAL UPPER BLEPHAROPLASTY (Bilateral)  Patient Location: PACU  Anesthesia Type: GA  Level of Consciousness: awake, sedated, patient cooperative and responds to stimulation, sleepy stable   Airway & Oxygen Therapy: Patient Spontanous Breathing and Patient connected to [place] oxygen  Post-op Assessment: Report given to PACU RN, Post -op Vital signs reviewed and stable and Patient sleepy   Post vital signs: Reviewed and stable  Complications: No apparent anesthesia complications

## 2024-04-05 NOTE — Anesthesia Preprocedure Evaluation (Addendum)
 Anesthesia Evaluation  Patient identified by MRN, date of birth, ID band Patient awake    Reviewed: Allergy & Precautions, H&P , NPO status , Patient's Chart, lab work & pertinent test results  Airway Mallampati: III  TM Distance: >3 FB Neck ROM: Full    Dental no notable dental hx. (+) Teeth Intact, Dental Advisory Given   Pulmonary neg pulmonary ROS   Pulmonary exam normal breath sounds clear to auscultation       Cardiovascular negative cardio ROS  Rhythm:Regular Rate:Normal     Neuro/Psych negative neurological ROS  negative psych ROS   GI/Hepatic Neg liver ROS,GERD  Medicated,,  Endo/Other  negative endocrine ROS    Renal/GU negative Renal ROS  negative genitourinary   Musculoskeletal  (+) Arthritis , Osteoarthritis,    Abdominal   Peds  Hematology negative hematology ROS (+)   Anesthesia Other Findings   Reproductive/Obstetrics negative OB ROS                             Anesthesia Physical Anesthesia Plan  ASA: 2  Anesthesia Plan: General   Post-op Pain Management: Tylenol  PO (pre-op)*   Induction: Intravenous  PONV Risk Score and Plan: 3 and Ondansetron , Dexamethasone, Treatment may vary due to age or medical condition, Propofol infusion and TIVA  Airway Management Planned: Oral ETT  Additional Equipment:   Intra-op Plan:   Post-operative Plan: Extubation in OR  Informed Consent: I have reviewed the patients History and Physical, chart, labs and discussed the procedure including the risks, benefits and alternatives for the proposed anesthesia with the patient or authorized representative who has indicated his/her understanding and acceptance.     Dental advisory given  Plan Discussed with: CRNA  Anesthesia Plan Comments:        Anesthesia Quick Evaluation

## 2024-04-05 NOTE — Anesthesia Procedure Notes (Signed)
 Procedure Name: Intubation Date/Time: 04/05/2024 7:34 AM  Performed by: Lucky Sable, CRNAPre-anesthesia Checklist: Patient identified, Emergency Drugs available, Suction available, Patient being monitored and Timeout performed Patient Re-evaluated:Patient Re-evaluated prior to induction Oxygen Delivery Method: Circle system utilized Preoxygenation: Pre-oxygenation with 100% oxygen Induction Type: IV induction Ventilation: Mask ventilation without difficulty Laryngoscope Size: Mac and 4 Grade View: Grade II Tube type: Oral Tube size: 7.5 mm Number of attempts: 1 Airway Equipment and Method: Stylet and Oral airway Placement Confirmation: ETT inserted through vocal cords under direct vision, positive ETCO2, breath sounds checked- equal and bilateral and CO2 detector Secured at: 23 cm Tube secured with: Tape Dental Injury: Teeth and Oropharynx as per pre-operative assessment

## 2024-04-05 NOTE — Discharge Instructions (Signed)

## 2024-04-05 NOTE — Op Note (Signed)
 Operative Note   DATE OF OPERATION: 4.29.2025  LOCATION: [facility]-outpatient  SURGICAL DIVISION: Plastic Surgery  PREOPERATIVE DIAGNOSES:  Dermatochalasis  POSTOPERATIVE DIAGNOSES:  same  PROCEDURE:  Bilateral upper blepharoplasty with skin weighing eyelid  SURGEON: Alger Infield MD MBA  ASSISTANT: none  ANESTHESIA:  General.   EBL: minimal  COMPLICATIONS: None immediate.   INDICATIONS FOR PROCEDURE:  The patient, Willie Campos, is a 73 y.o. male born on 16-Feb-1951, is here for treatment obstructed vision in setting dermatochalasis.   FINDINGS: Skin only upper blepharoplasty.  DESCRIPTION OF PROCEDURE:  The patient was taken to the operating room. SCDs were placed and IV antibiotics were given. Ophthalmic ointment and scleral shields placed. The patient's operative site was prepped and draped in a sterile fashion. A time out was performed and all information was confirmed to be correct. Caudal incision marked 7 mm from lash line. Cephalic extent resection marked at brow lid junction. Care taken to limit skin excision medially to 5 mm. Local anesthetic infiltrated to perform bilateral supraorbital and infraorbital nerve blocks and in area anticipated skin resection. Over right, skin incised sharply. Skin only flap excised with cautery. Hemostasis obtained. Closure completed with interrupted and running 6-0 prolene. I then directed my attention to left side. Skin incised sharply. Skin only flap excised with cautery. Hemostasis obtained. Closure completed with interrupted and running 6-0 prolene.   Scleral shields removed. Eyes irrigated with basic salt solution. Tobradex ophthalmic ointment placed in each eye.   The patient was allowed to wake from anesthesia, extubated and taken to the recovery room in satisfactory condition.   SPECIMENS: none  DRAINS: none  Alger Infield, MD [facility]  Office/ physician access line after hours  [phone]

## 2024-04-05 NOTE — Interval H&P Note (Signed)
 History and Physical Interval Note:  04/05/2024 6:57 AM  Willie Campos  has presented today for surgery, with the diagnosis of dermatochalasis.  The various methods of treatment have been discussed with the patient and family. After consideration of risks, benefits and other options for treatment, the patient has consented to  Procedure(s) with comments: BLEPHAROPLASTY (Bilateral) - *BILATERAL UPPER BLEPHAROPLASTY* as a surgical intervention.  The patient's history has been reviewed, patient examined, no change in status, stable for surgery.  I have reviewed the patient's chart and labs.  Questions were answered to the patient's satisfaction.     Lisabeth Rider Dondrea Clendenin

## 2024-09-21 ENCOUNTER — Ambulatory Visit (INDEPENDENT_AMBULATORY_CARE_PROVIDER_SITE_OTHER)

## 2024-09-21 DIAGNOSIS — Z1283 Encounter for screening for malignant neoplasm of skin: Secondary | ICD-10-CM | POA: Diagnosis not present

## 2024-09-21 DIAGNOSIS — W908XXA Exposure to other nonionizing radiation, initial encounter: Secondary | ICD-10-CM | POA: Diagnosis not present

## 2024-09-21 DIAGNOSIS — L570 Actinic keratosis: Secondary | ICD-10-CM

## 2024-09-21 DIAGNOSIS — L821 Other seborrheic keratosis: Secondary | ICD-10-CM

## 2024-09-21 DIAGNOSIS — D1801 Hemangioma of skin and subcutaneous tissue: Secondary | ICD-10-CM

## 2024-09-21 DIAGNOSIS — L814 Other melanin hyperpigmentation: Secondary | ICD-10-CM | POA: Diagnosis not present

## 2024-09-21 DIAGNOSIS — D229 Melanocytic nevi, unspecified: Secondary | ICD-10-CM

## 2024-09-21 DIAGNOSIS — L578 Other skin changes due to chronic exposure to nonionizing radiation: Secondary | ICD-10-CM

## 2024-09-21 NOTE — Patient Instructions (Addendum)
 Cryotherapy Aftercare  Wash gently with soap and water everyday.   Apply Vaseline and Band-Aid daily until healed.   Sunscreen  Who needs sunscreen? Everyone. Sunscreen use can help prevent skin cancer by protecting you from the sun's harmful ultraviolet rays. Anyone can get skin cancer, regardless of age, gender or race. In fact, it is estimated that one in five Americans will develop skin cancer in their lifetime.  Sunscreen alone cannot fully protect you. In addition to wearing sunscreen, dermatologists recommend taking the following steps to protect your skin and find skin cancer early:  Seek shade when appropriate, remembering that the sun's rays are strongest between 10 a.m. and 2 p.m. If your shadow is shorter than you are, seek shade. Dress to protect yourself from the sun by wearing a lightweight long-sleeved shirt, pants, a wide-brimmed hat and sunglasses, when possible.  Use extra caution near water, snow and sand as they reflect the damaging rays of the sun, which can increase your chance of sunburn.  Get vitamin D  safely through a healthy diet that may include vitamin supplements. Don't seek the sun. Avoid tanning beds. Ultraviolet light from the sun and tanning beds can cause skin cancer and wrinkling. If you want to look tan, you may wish to use a self-tanning product, but continue to use sunscreen with it.  When should I use sunscreen? Every day you go outside--even if you're just walking to and from your form of transportation. The sun emits harmful UV rays year-round. Even on cloudy days, up to 80 percent of the sun's harmful UV rays can penetrate your skin. Snow, sand and water increase the need for sunscreen because they reflect the sun's rays.  How much sunscreen should I use, and how often should I apply it? Most people only apply 25-50 percent of the recommended amount of sunscreen. Apply enough sunscreen to cover all exposed skin. Most adults need about 1 ounce -- or enough  to fill a shot glass -- to fully cover their body.  Don't forget to apply to the tops of your feet, your neck, your ears and the top of your head. Apply sunscreen to dry skin 15 minutes before going outdoors.  Skin cancer also can form on the lips. To protect your lips, apply a lip balm or lipstick that contains sunscreen with an SPF of 30 or higher.  When outdoors, reapply sunscreen approximately every two hours, or after swimming or sweating, according to the directions on the bottle.   Broad-spectrum sunscreens protect against both UVA and UVB rays. What is the difference between the rays? Sunlight consists of two types of harmful rays that reach the earth -- UVA rays and UVB rays. Overexposure to either can lead to skin cancer. In addition to causing skin cancer, here's what each of these rays do:  UVA rays (or aging rays) can prematurely age your skin, causing wrinkles and age spots, and can pass through window glass. UVB rays (or burning rays) are the primary cause of sunburn and are blocked by window glass  There is no safe way to tan. Every time you tan, you damage your skin. As this damage builds, you speed up the aging of your skin and increase your risk for all types of skin cancer.  What is the difference between chemical and physical sunscreens? Chemical sunscreens work like a sponge, absorbing the sun's rays. They contain one or more of the following active ingredients: oxybenzone, avobenzone, octisalate, octocrylene, homosalate and octinoxate. These formulations tend  to be easier to rub into the skin without leaving a white residue.   Physical sunscreens work like a shield, sitting sit on the surface of your skin and deflecting the sun's rays. They contain the active ingredients zinc oxide and/or titanium dioxide. Use this sunscreen if you have sensitive skin.   What type of sunscreen should I use? The best type of sunscreen is the one you will use again and again. Just make sure it  offers broad-spectrum (UVA and UVB) protection, has an SPF of 30+, and is water-resistant. The kind of sunscreen you use is a matter of personal choice, and may vary depending on the area of the body to be protected. Available sunscreen options include lotions, creams, gels, ointments, wax sticks and sprays.  Recommended physical sunscreens for face: - Neutrogena Sheer Zinc - Aveeno Positively Mineral Sensitive - CeraVe Hydrating Mineral (also has a tinted version) - La Roche-Posay Anthelios Mineral Face (comes as a cream, lotion, light fluid, and there is also a tinted version).  - EltaMD UV Clear (also has a tinted version)  Recommended physical sunscreens for body: - Neutrogena Sheer Zinc Dry-Touch Sunscreen Sensitive Skin Lotion Broad Spectrum SPF 50 - Aveeno Positively Mineral Sensitive Skin Sunscreen Broad Spectrum SPF 50 - La Roche-Posay Anthelios SPF 50 Mineral Sunscreen - Gentle Lotion - CeraVe Hydrating Mineral Sunscreen SPF 50  Recommended chemical sunscreens for face: - Anthelios UV Correct Face Sunscreen SPF 70 with Niacinamide - Neutrogena Clear Face Oil-Free SPF 50 with Helioplex - Neutrogena Sport Face Oil-Free SPF 70+ with Helioplex - Aveeno Protect + Hydrate Sunscreen For Face SPF 70 - La Roche-Posay Anthelios Light Fluid Sunscreen for Face SPF 60  Recommended chemical sunscreens for body: - Neutrogena Ultra Sheer Dry-Touch Sunscreen SPF 70 - Aveeno Protect + Hydrate Broad Spectrum All-Day Hydration SPF 60 (comes in a big pump) - La Roche-Posay Anthelios Melt-In Milk Sunscreen SPF 60   Melanoma ABCDEs  Melanoma is the most dangerous type of skin cancer, and is the leading cause of death from skin disease.  You are more likely to develop melanoma if you: Have light-colored skin, light-colored eyes, or red or blond hair Spend a lot of time in the sun Tan regularly, either outdoors or in a tanning bed Have had blistering sunburns, especially during childhood Have a  close family member who has had a melanoma Have atypical moles or large birthmarks  Early detection of melanoma is key since treatment is typically straightforward and cure rates are extremely high if we catch it early.   The first sign of melanoma is often a change in a mole or a new dark spot.  The ABCDE system is a way of remembering the signs of melanoma.  A for asymmetry:  The two halves do not match. B for border:  The edges of the growth are irregular. C for color:  A mixture of colors are present instead of an even brown color. D for diameter:  Melanomas are usually (but not always) greater than 6mm - the size of a pencil eraser. E for evolution:  The spot keeps changing in size, shape, and color.  Please check your skin once per month between visits. You can use a small mirror in front and a large mirror behind you to keep an eye on the back side or your body.   If you see any new or changing lesions before your next follow-up, please call to schedule a visit.  Please continue daily skin protection including  broad spectrum sunscreen SPF 30+ to sun-exposed areas, reapplying every 2 hours as needed when you're outdoors.     Due to recent changes in healthcare laws, you may see results of your pathology and/or laboratory studies on MyChart before the doctors have had a chance to review them. We understand that in some cases there may be results that are confusing or concerning to you. Please understand that not all results are received at the same time and often the doctors may need to interpret multiple results in order to provide you with the best plan of care or course of treatment. Therefore, we ask that you please give us  2 business days to thoroughly review all your results before contacting the office for clarification. Should we see a critical lab result, you will be contacted sooner.   If You Need Anything After Your Visit  If you have any questions or concerns for your  doctor, please call our main line at [phone] and press option 4 to reach your doctor's medical assistant. If no one answers, please leave a voicemail as directed and we will return your call as soon as possible. Messages left after 4 pm will be answered the following business day.   You may also send us  a message via MyChart. We typically respond to MyChart messages within 1-2 business days.  For prescription refills, please ask your pharmacy to contact our office. Our fax number is [phone].  If you have an urgent issue when the clinic is closed that cannot wait until the next business day, you can page your doctor at the number below.    Please note that while we do our best to be available for urgent issues outside of office hours, we are not available 24/7.   If you have an urgent issue and are unable to reach us , you may choose to seek medical care at your doctor's office, retail clinic, urgent care center, or emergency room.  If you have a medical emergency, please immediately call 911 or go to the emergency department.  Pager Numbers  - Dr. Hester: [phone]  - Dr. Jackquline: [phone]  - Dr. Claudene: [phone]   - Dr. Raymund: [phone]  In the event of inclement weather, please call our main line at [phone] for an update on the status of any delays or closures.  Dermatology Medication Tips: Please keep the boxes that topical medications come in in order to help keep track of the instructions about where and how to use these. Pharmacies typically print the medication instructions only on the boxes and not directly on the medication tubes.   If your medication is too expensive, please contact our office at [phone] option 4 or send us  a message through MyChart.   We are unable to tell what your co-pay for medications will be in advance as this is different depending on your insurance coverage. However, we may be able to find a substitute medication at  lower cost or fill out paperwork to get insurance to cover a needed medication.   If a prior authorization is required to get your medication covered by your insurance company, please allow us  1-2 business days to complete this process.  Drug prices often vary depending on where the prescription is filled and some pharmacies may offer cheaper prices.  The website www.goodrx.com contains coupons for medications through different pharmacies. The prices here do not account for what the cost may be with help from insurance (it may be cheaper with your  insurance), but the website can give you the price if you did not use any insurance.  - You can print the associated coupon and take it with your prescription to the pharmacy.  - You may also stop by our office during regular business hours and pick up a GoodRx coupon card.  - If you need your prescription sent electronically to a different pharmacy, notify our office through [facility] or by phone at [phone] option 4.     Si Usted Necesita Algo Despus de Su Visita  Tambin puede enviarnos un mensaje a travs de Clinical cytogeneticist. Por lo general respondemos a los mensajes de MyChart en el transcurso de 1 a 2 das hbiles.  Para renovar recetas, por favor pida a su farmacia que se ponga en contacto con nuestra oficina. Randi lakes de fax es [place] [phone].  Si tiene un asunto urgente cuando la clnica est cerrada y que no puede esperar hasta el siguiente da hbil, puede llamar/localizar a su doctor(a) al nmero que aparece a continuacin.   Por favor, tenga en cuenta que aunque hacemos todo lo posible para estar disponibles para asuntos urgentes fuera del horario de [place], no estamos disponibles las 24 horas del da, los 7 [address]  [address] de la [place].   Si tiene un problema urgente y no puede comunicarse con nosotros, puede optar por buscar atencin mdica  en el consultorio de su doctor(a), en una clnica privada, en un centro de atencin urgente  o en una sala de emergencias.  Si tiene Engineer, drilling, por favor llame inmediatamente al 911 o vaya a la sala de emergencias.  Nmeros de bper  - Dr. Hester: [phone]  - Dra. Jackquline: [phone]  - Dr. Claudene: [phone]  - Dra. Kitts: [phone]  En caso de inclemencias del Universal, por favor llame a nuestra lnea principal al [phone] para una actualizacin sobre el estado de cualquier retraso o cierre.  Consejos para la medicacin en dermatologa: Por favor, guarde las cajas en las que vienen los medicamentos de uso tpico para ayudarle a seguir las instrucciones sobre dnde y cmo usarlos. Las farmacias generalmente imprimen las instrucciones del medicamento slo en las cajas y no directamente en los tubos del [place].   Si su medicamento es muy caro, por favor, pngase en contacto con landry rieger llamando al [phone] y presione la opcin 4 o envenos un mensaje a travs de Clinical cytogeneticist.   No podemos decirle cul ser su copago por los medicamentos por adelantado ya que esto es diferente dependiendo de la cobertura de su seguro. Sin embargo, es posible que podamos encontrar un medicamento sustituto a Audiological scientist un formulario para que el seguro cubra el medicamento que se considera necesario.   Si se requiere una autorizacin previa para que su compaa de seguros malta su medicamento, por favor permtanos de 1 a 2 das hbiles para completar este proceso.  Los precios de los medicamentos varan con frecuencia dependiendo del Environmental consultant de dnde se surte la receta y alguna farmacias pueden ofrecer precios ms baratos.  El sitio web www.goodrx.com tiene cupones para medicamentos de Health and safety inspector. Los precios aqu no tienen en cuenta lo que podra costar con la ayuda del seguro (puede ser ms barato con su seguro), pero el sitio web puede darle el precio si no utiliz Tourist information centre manager.  - Puede imprimir el cupn correspondiente y llevarlo con su  receta a la farmacia.  - Tambin puede pasar por nuestra oficina durante el horario de atencin regular  y recoger una tarjeta de cupones de GoodRx.  - Si necesita que su receta se enve electrnicamente a una farmacia diferente, informe a nuestra oficina a travs de MyChart de [place] o por telfono llamando al [phone] y presione la opcin 4.

## 2024-09-21 NOTE — Progress Notes (Signed)
 Subjective   Willie Campos is a 73 y.o. male who presents for the following: Total body skin exam for skin cancer screening and mole check. The patient has spots, moles and lesions to be evaluated, some may be new or changing and the patient may have concern these could be cancer.. Patient is new patient  Today patient reports: No areas of concern at this time.   Review of Systems:    No other skin or systemic complaints except as noted in HPI or Assessment and Plan.  The following portions of the chart were reviewed this encounter and updated as appropriate: medications, allergies, medical history  Relevant Medical History:  n/a   Objective  Well appearing patient in no apparent distress; mood and affect are within normal limits. Examination was performed of the: Full Skin Examination: scalp, head, eyes, ears, nose, lips, neck, chest, axillae, abdomen, back, buttocks, bilateral upper extremities, bilateral lower extremities, hands, feet, fingers, toes, fingernails, and toenails.   Examination notable for: SKIN EXAM, Angioma(s): Scattered red vascular papule(s)  , Lentigo/lentigines: Scattered pigmented macules that are tan to brown in color and are somewhat non-uniform in shape and concentrated in the sun-exposed areas, Nevus/nevi: Scattered well-demarcated, regular, pigmented macule(s) and/or papule(s)  , Seborrheic Keratosis(es): Stuck-on appearing keratotic papule(s) on the trunk, none  irritated with redness, crusting, edema, and/or partial avulsion, Actinic Damage/Elastosis: chronic sun damage: dyspigmentation, telangiectasia, and wrinkling, Actinic keratosis: Scaly erythematous macule(s) concentrated on sun exposed areas   Examination limited by: Undergarments and Patient deferred removal     Face, Scalp (10) Pink scaly macules  Assessment & Plan   SKIN CANCER SCREENING PERFORMED TODAY.  BENIGN SKIN FINDINGS  - Lentigines  - Seborrheic keratoses  - Hemangiomas   -  Nevus/Multiple Benign Nevi  - Reassurance provided regarding the benign appearance of lesions noted on exam today; no treatment is indicated in the absence of symptoms/changes. - Reinforced importance of photoprotective strategies including liberal and frequent sunscreen use of a broad-spectrum SPF 30 or greater, use of protective clothing, and sun avoidance for prevention of cutaneous malignancy and photoaging.  Counseled patient on the importance of regular self-skin monitoring as well as routine clinical skin examinations as scheduled.   ACTINIC DAMAGE - Chronic condition, secondary to cumulative UV/sun exposure - Recommend daily broad spectrum sunscreen SPF 30+ to sun-exposed areas, reapply every 2 hours as needed.  - Staying in the shade or wearing long sleeves, sun glasses (UVA+UVB protection) and wide brim hats (4-inch brim around the entire circumference of the hat) are also recommended for sun protection.  - Call for new or changing lesions  Level of service outlined above   Procedures, orders, diagnosis for this visit:  ACTINIC KERATOSIS (10) Face, Scalp (10) Actinic keratoses are precancerous spots that appear secondary to cumulative UV radiation exposure/sun exposure over time. They are chronic with expected duration over 1 year. A portion of actinic keratoses will progress to squamous cell carcinoma of the skin. It is not possible to reliably predict which spots will progress to skin cancer and so treatment is recommended to prevent development of skin cancer.  Recommend daily broad spectrum sunscreen SPF 30+ to sun-exposed areas, reapply every 2 hours as needed.  Recommend staying in the shade or wearing long sleeves, sun glasses (UVA+UVB protection) and wide brim hats (4-inch brim around the entire circumference of the hat). Call for new or changing lesions. Destruction of lesion - Face, Scalp (10) Complexity: simple   Destruction method: cryotherapy  Informed consent:  discussed and consent obtained   Timeout:  patient name, date of birth, surgical site, and procedure verified Lesion destroyed using liquid nitrogen: Yes   Region frozen until ice ball extended beyond lesion: Yes   Cryo cycles: 1 or 2. Outcome: patient tolerated procedure well with no complications   Post-procedure details: wound care instructions given     Actinic keratosis -     Destruction of lesion    Return to clinic: Return in about 1 year (around 09/21/2025) for TBSE.  I, Emerick Ege, CMA am acting as scribe for Lauraine JAYSON Kanaris, MD.   Documentation: I have reviewed the above documentation for accuracy and completeness, and I agree with the above.  Lauraine JAYSON Kanaris, MD

## 2024-12-21 ENCOUNTER — Ambulatory Visit: Payer: Medicare (Managed Care)

## 2024-12-21 DIAGNOSIS — Z1283 Encounter for screening for malignant neoplasm of skin: Secondary | ICD-10-CM

## 2024-12-21 DIAGNOSIS — L821 Other seborrheic keratosis: Secondary | ICD-10-CM | POA: Diagnosis not present

## 2024-12-21 DIAGNOSIS — Z872 Personal history of diseases of the skin and subcutaneous tissue: Secondary | ICD-10-CM | POA: Diagnosis not present

## 2024-12-21 DIAGNOSIS — L814 Other melanin hyperpigmentation: Secondary | ICD-10-CM | POA: Diagnosis not present

## 2024-12-21 DIAGNOSIS — L570 Actinic keratosis: Secondary | ICD-10-CM | POA: Diagnosis not present

## 2024-12-21 DIAGNOSIS — D1801 Hemangioma of skin and subcutaneous tissue: Secondary | ICD-10-CM | POA: Diagnosis not present

## 2024-12-21 DIAGNOSIS — L578 Other skin changes due to chronic exposure to nonionizing radiation: Secondary | ICD-10-CM

## 2024-12-21 DIAGNOSIS — D229 Melanocytic nevi, unspecified: Secondary | ICD-10-CM

## 2024-12-21 DIAGNOSIS — W908XXA Exposure to other nonionizing radiation, initial encounter: Secondary | ICD-10-CM | POA: Diagnosis not present

## 2024-12-21 NOTE — Patient Instructions (Addendum)
 Sunscreen  Who needs sunscreen? Everyone. Sunscreen use can help prevent skin cancer by protecting you from the sun's harmful ultraviolet rays. Anyone can get skin cancer, regardless of age, gender or race. In fact, it is estimated that one in five Americans will develop skin cancer in their lifetime.  Sunscreen alone cannot fully protect you. In addition to wearing sunscreen, dermatologists recommend taking the following steps to protect your skin and find skin cancer early:  Seek shade when appropriate, remembering that the sun's rays are strongest between 10 a.m. and 2 p.m. If your shadow is shorter than you are, seek shade. Dress to protect yourself from the sun by wearing a lightweight long-sleeved shirt, pants, a wide-brimmed hat and sunglasses, when possible.  Use extra caution near water, snow and sand as they reflect the damaging rays of the sun, which can increase your chance of sunburn.  Get vitamin D safely through a healthy diet that may include vitamin supplements. Don't seek the sun. Avoid tanning beds. Ultraviolet light from the sun and tanning beds can cause skin cancer and wrinkling. If you want to look tan, you may wish to use a self-tanning product, but continue to use sunscreen with it.  When should I use sunscreen? Every day you go outside--even if you're just walking to and from your form of transportation. The sun emits harmful UV rays year-round. Even on cloudy days, up to 80 percent of the sun's harmful UV rays can penetrate your skin. Snow, sand and water increase the need for sunscreen because they reflect the sun's rays.  How much sunscreen should I use, and how often should I apply it? Most people only apply 25-50 percent of the recommended amount of sunscreen. Apply enough sunscreen to cover all exposed skin. Most adults need about 1 ounce -- or enough to fill a shot glass -- to fully cover their body.  Don't forget to apply to the tops of your feet, your neck, your ears  and the top of your head. Apply sunscreen to dry skin 15 minutes before going outdoors.  Skin cancer also can form on the lips. To protect your lips, apply a lip balm or lipstick that contains sunscreen with an SPF of 30 or higher.  When outdoors, reapply sunscreen approximately every two hours, or after swimming or sweating, according to the directions on the bottle.   Broad-spectrum sunscreens protect against both UVA and UVB rays. What is the difference between the rays? Sunlight consists of two types of harmful rays that reach the earth -- UVA rays and UVB rays. Overexposure to either can lead to skin cancer. In addition to causing skin cancer, here's what each of these rays do:  UVA rays (or aging rays) can prematurely age your skin, causing wrinkles and age spots, and can pass through window glass. UVB rays (or burning rays) are the primary cause of sunburn and are blocked by window glass  There is no safe way to tan. Every time you tan, you damage your skin. As this damage builds, you speed up the aging of your skin and increase your risk for all types of skin cancer.  What is the difference between chemical and physical sunscreens? Chemical sunscreens work like a sponge, absorbing the sun's rays. They contain one or more of the following active ingredients: oxybenzone, avobenzone, octisalate, octocrylene, homosalate and octinoxate. These formulations tend to be easier to rub into the skin without leaving a white residue.   Physical sunscreens work like a shield,  sitting sit on the surface of your skin and deflecting the sun's rays. They contain the active ingredients zinc oxide and/or titanium dioxide. Use this sunscreen if you have sensitive skin.   What type of sunscreen should I use? The best type of sunscreen is the one you will use again and again. Just make sure it offers broad-spectrum (UVA and UVB) protection, has an SPF of 30+, and is water-resistant. The kind of sunscreen you use is  a matter of personal choice, and may vary depending on the area of the body to be protected. Available sunscreen options include lotions, creams, gels, ointments, wax sticks and sprays.  Recommended physical sunscreens for face: - Neutrogena Sheer Zinc - Aveeno Positively Mineral Sensitive - CeraVe Hydrating Mineral (also has a tinted version) - La Roche-Posay Anthelios Mineral Face (comes as a cream, lotion, light fluid, and there is also a tinted version).  - EltaMD UV Clear (also has a tinted version)  Recommended physical sunscreens for body: - Neutrogena Sheer Zinc Dry-Touch Sunscreen Sensitive Skin Lotion Broad Spectrum SPF 50 - Aveeno Positively Mineral Sensitive Skin Sunscreen Broad Spectrum SPF 50 - La Roche-Posay Anthelios SPF 50 Mineral Sunscreen - Gentle Lotion - CeraVe Hydrating Mineral Sunscreen SPF 50  Recommended chemical sunscreens for face: - Anthelios UV Correct Face Sunscreen SPF 70 with Niacinamide - Neutrogena Clear Face Oil-Free SPF 50 with Helioplex - Neutrogena Sport Face Oil-Free SPF 70+ with Helioplex - Aveeno Protect + Hydrate Sunscreen For Face SPF 70 - La Roche-Posay Anthelios Light Fluid Sunscreen for Face SPF 60  Recommended chemical sunscreens for body: - Neutrogena Ultra Sheer Dry-Touch Sunscreen SPF 70 - Aveeno Protect + Hydrate Broad Spectrum All-Day Hydration SPF 60 (comes in a big pump) - La Roche-Posay Anthelios Melt-In Milk Sunscreen SPF 60   Due to recent changes in healthcare laws, you may see results of your pathology and/or laboratory studies on MyChart before the doctors have had a chance to review them. We understand that in some cases there may be results that are confusing or concerning to you. Please understand that not all results are received at the same time and often the doctors may need to interpret multiple results in order to provide you with the best plan of care or course of treatment. Therefore, we ask that you please give us  2  business days to thoroughly review all your results before contacting the office for clarification. Should we see a critical lab result, you will be contacted sooner.   If You Need Anything After Your Visit  If you have any questions or concerns for your doctor, please call our main line at [phone] and press option 4 to reach your doctor's medical assistant. If no one answers, please leave a voicemail as directed and we will return your call as soon as possible. Messages left after 4 pm will be answered the following business day.   You may also send us  a message via MyChart. We typically respond to MyChart messages within 1-2 business days.  For prescription refills, please ask your pharmacy to contact our office. Our fax number is [phone].  If you have an urgent issue when the clinic is closed that cannot wait until the next business day, you can page your doctor at the number below.    Please note that while we do our best to be available for urgent issues outside of office hours, we are not available 24/7.   If you have an urgent issue and are unable  to reach us , you may choose to seek medical care at your doctor's office, retail clinic, urgent care center, or emergency room.  If you have a medical emergency, please immediately call 911 or go to the emergency department.  Pager Numbers  - Dr. Hester: [phone]  - Dr. Jackquline: [phone]  - Dr. Claudene: [phone]   - Dr. Raymund: [phone]  In the event of inclement weather, please call our main line at [phone] for an update on the status of any delays or closures.  Dermatology Medication Tips: Please keep the boxes that topical medications come in in order to help keep track of the instructions about where and how to use these. Pharmacies typically print the medication instructions only on the boxes and not directly on the medication tubes.   If your medication is too expensive, please contact our office  at [phone] option 4 or send us  a message through MyChart.   We are unable to tell what your co-pay for medications will be in advance as this is different depending on your insurance coverage. However, we may be able to find a substitute medication at lower cost or fill out paperwork to get insurance to cover a needed medication.   If a prior authorization is required to get your medication covered by your insurance company, please allow us  1-2 business days to complete this process.  Drug prices often vary depending on where the prescription is filled and some pharmacies may offer cheaper prices.  The website www.goodrx.com contains coupons for medications through different pharmacies. The prices here do not account for what the cost may be with help from insurance (it may be cheaper with your insurance), but the website can give you the price if you did not use any insurance.  - You can print the associated coupon and take it with your prescription to the pharmacy.  - You may also stop by our office during regular business hours and pick up a GoodRx coupon card.  - If you need your prescription sent electronically to a different pharmacy, notify our office through [facility] or by phone at [phone] option 4.     Si Usted Necesita Algo Despus de Su Visita  Tambin puede enviarnos un mensaje a travs de Clinical cytogeneticist. Por lo general respondemos a los mensajes de MyChart en el transcurso de 1 a 2 das hbiles.  Para renovar recetas, por favor pida a su farmacia que se ponga en contacto con nuestra oficina. Randi lakes de fax es [place] [phone].  Si tiene un asunto urgente cuando la clnica est cerrada y que no puede esperar hasta el siguiente da hbil, puede llamar/localizar a su doctor(a) al nmero que aparece a continuacin.   Por favor, tenga en cuenta que aunque hacemos todo lo posible para estar disponibles para asuntos urgentes fuera del horario de [place], no estamos  disponibles las 24 horas del da, los 7 [address]  [address] de la [place].   Si tiene un problema urgente y no puede comunicarse con nosotros, puede optar por buscar atencin mdica  en el consultorio de su doctor(a), en una clnica privada, en un centro de atencin urgente o en una sala de emergencias.  Si tiene Engineer, drilling, por favor llame inmediatamente al 911 o vaya a la sala de emergencias.  Nmeros de bper  - Dr. Hester: [phone]  - Dra. Jackquline: [phone]  - Dr. Claudene: [phone]  - Dra. Kitts: [phone]  En caso de inclemencias del tiempo, por favor llame a nuestra  lnea principal al [phone] para una actualizacin sobre el [place] de cualquier retraso o cierre.  Consejos para la medicacin en dermatologa: Por favor, guarde las cajas en las que vienen los medicamentos de uso tpico para ayudarle a seguir las instrucciones sobre dnde y cmo usarlos. Las farmacias generalmente imprimen las instrucciones del medicamento slo en las cajas y no directamente en los tubos del [place].   Si su medicamento es muy caro, por favor, pngase en contacto con landry rieger llamando al [phone] y presione la opcin 4 o envenos un mensaje a travs de Clinical cytogeneticist.   No podemos decirle cul ser su copago por los medicamentos por adelantado ya que esto es diferente dependiendo de la cobertura de su seguro. Sin embargo, es posible que podamos encontrar un medicamento sustituto a Audiological scientist un formulario para que el seguro cubra el medicamento que se considera necesario.   Si se requiere una autorizacin previa para que su compaa de seguros malta su medicamento, por favor permtanos de 1 a 2 das hbiles para completar este proceso.  Los precios de los medicamentos varan con frecuencia dependiendo del Environmental consultant de dnde se surte la receta y alguna farmacias pueden ofrecer precios ms baratos.  El sitio web www.goodrx.com tiene cupones para medicamentos de Engineer, civil (consulting). Los precios aqu no tienen en cuenta lo que podra costar con la ayuda del seguro (puede ser ms barato con su seguro), pero el sitio web puede darle el precio si no utiliz Tourist information centre manager.  - Puede imprimir el cupn correspondiente y llevarlo con su receta a la farmacia.  - Tambin puede pasar por nuestra oficina durante el horario de atencin regular y Education officer, museum una tarjeta de cupones de GoodRx.  - Si necesita que su receta se enve electrnicamente a una farmacia diferente, informe a nuestra oficina a travs de MyChart de [place] o por telfono llamando al [phone] y presione la opcin 4.

## 2024-12-21 NOTE — Progress Notes (Signed)
 "   Subjective   Willie Campos is a 74 y.o. male who presents for the following: Total body skin exam for skin cancer screening and mole check. The patient has spots, moles and lesions to be evaluated, some may be new or changing and the patient may have concern these could be cancer.. Patient is established patient   Today patient reports: Patient states no concerns today. Hx of AK's  Review of Systems:    No other skin or systemic complaints except as noted in HPI or Assessment and Plan.  The following portions of the chart were reviewed this encounter and updated as appropriate: medications, allergies, medical history  Relevant Medical History:  n/a   Objective  (SKPE) Well appearing patient in no apparent distress; mood and affect are within normal limits. Examination was performed of the: Full Skin Examination: scalp, head, eyes, ears, nose, lips, neck, chest, axillae, abdomen, back, buttocks, bilateral upper extremities, bilateral lower extremities, hands, feet, fingers, toes, fingernails, and toenails.   Examination notable for: SKIN EXAM, Angioma(s): Scattered red vascular papule(s)  , Lentigo/lentigines: Scattered pigmented macules that are tan to brown in color and are somewhat non-uniform in shape and concentrated in the sun-exposed areas, Nevus/nevi: Scattered well-demarcated, regular, pigmented macule(s) and/or papule(s)  , Seborrheic Keratosis(es): Stuck-on appearing keratotic papule(s) on the trunk, none  irritated with redness, crusting, edema, and/or partial avulsion, Actinic Damage/Elastosis: chronic sun damage: dyspigmentation, telangiectasia, and wrinkling, Actinic keratosis: Scaly erythematous macule(s) concentrated on sun exposed areas   Examination limited by: Undergarments   Face x4 (4) Erythematous thin papules/macules with gritty scale.  Assessment & Plan  (SKAP)   SKIN CANCER SCREENING PERFORMED TODAY.  BENIGN SKIN FINDINGS  - Lentigines  - Seborrheic  keratoses  - Hemangiomas   - Nevus/Multiple Benign Nevi - Reassurance provided regarding the benign appearance of lesions noted on exam today; no treatment is indicated in the absence of symptoms/changes. - Reinforced importance of photoprotective strategies including liberal and frequent sunscreen use of a broad-spectrum SPF 30 or greater, use of protective clothing, and sun avoidance for prevention of cutaneous malignancy and photoaging.  Counseled patient on the importance of regular self-skin monitoring as well as routine clinical skin examinations as scheduled.   ACTINIC DAMAGE - Chronic condition, secondary to cumulative UV/sun exposure - Recommend daily broad spectrum sunscreen SPF 30+ to sun-exposed areas, reapply every 2 hours as needed.  - Staying in the shade or wearing long sleeves, sun glasses (UVA+UVB protection) and wide brim hats (4-inch brim around the entire circumference of the hat) are also recommended for sun protection.  - Call for new or changing lesions.  Personal history of actinic keratosis  - Reviewed medical history for full details  - Reviewed sun protective measures as above - Encouraged full body skin exams     Was sun protection counseling provided?: Yes   Level of service outlined above   Patient instructions (SKPI)   Procedures, orders, diagnosis for this visit:  AK (ACTINIC KERATOSIS) (4) Face x4 (4) Actinic keratoses are precancerous spots that appear secondary to cumulative UV radiation exposure/sun exposure over time. They are chronic with expected duration over 1 year. A portion of actinic keratoses will progress to squamous cell carcinoma of the skin. It is not possible to reliably predict which spots will progress to skin cancer and so treatment is recommended to prevent development of skin cancer.  Recommend daily broad spectrum sunscreen SPF 30+ to sun-exposed areas, reapply every 2 hours as needed.  Recommend staying in the shade or wearing  long sleeves, sun glasses (UVA+UVB protection) and wide brim hats (4-inch brim around the entire circumference of the hat). Call for new or changing lesions. - Destruction of lesion - Face x4 (4) Complexity: simple   Destruction method: cryotherapy   Informed consent: discussed and consent obtained   Timeout:  patient name, date of birth, surgical site, and procedure verified Lesion destroyed using liquid nitrogen: Yes   Region frozen until ice ball extended beyond lesion: Yes   Cryo cycles: 1 or 2. Outcome: patient tolerated procedure well with no complications   Post-procedure details: wound care instructions given   Additional details:  Prior to procedure, discussed risks of blister formation, small wound, skin dyspigmentation, or rare scar following cryotherapy. Recommend Vaseline ointment to treated areas while healing.    AK (actinic keratosis) -     Destruction of lesion    Return to clinic: Return in about 1 year (around 12/21/2025) for TBSE, w/ Dr. Raymund.  I, Almetta Nora, RMA, am acting as scribe for Lauraine JAYSON Raymund, MD .   Documentation: I have reviewed the above documentation for accuracy and completeness, and I agree with the above.  Lauraine JAYSON Raymund, MD  "

## 2025-09-21 ENCOUNTER — Ambulatory Visit

## 2025-12-21 ENCOUNTER — Ambulatory Visit
